# Patient Record
Sex: Male | Born: 1990 | Race: White | Hispanic: No | Marital: Single | Smoking: Former smoker
Health system: Southern US, Community
[De-identification: ages and names within clinical notes are randomized; demographics above are authoritative.]

## PROBLEM LIST (undated history)

## (undated) HISTORY — PX: TONSILLECTOMY: SUR1361

## (undated) HISTORY — PX: HERNIA REPAIR: SHX51

## (undated) HISTORY — PX: EYE SURGERY: SHX253

---

## 2011-08-27 ENCOUNTER — Inpatient Hospital Stay (INDEPENDENT_AMBULATORY_CARE_PROVIDER_SITE_OTHER)
Admission: RE | Admit: 2011-08-27 | Discharge: 2011-08-27 | Disposition: A | Discharge status: Home / Self Care | Payer: BC Managed Care – PPO | Source: Ambulatory Visit | Attending: Family Medicine | Admitting: Family Medicine

## 2011-08-27 DIAGNOSIS — N342 Other urethritis: Secondary | ICD-10-CM

## 2011-08-28 LAB — GC/CHLAMYDIA PROBE AMP, GENITAL
Chlamydia, DNA Probe: NEGATIVE
GC Probe Amp, Genital: NEGATIVE

## 2015-10-27 ENCOUNTER — Encounter: Payer: Self-pay | Admitting: Emergency Medicine

## 2015-10-27 ENCOUNTER — Ambulatory Visit
Admission: EM | Admit: 2015-10-27 | Discharge: 2015-10-27 | Disposition: A | Discharge status: Home / Self Care | Payer: BC Managed Care – PPO | Attending: Family Medicine | Admitting: Family Medicine

## 2015-10-27 DIAGNOSIS — R300 Dysuria: Secondary | ICD-10-CM | POA: Diagnosis not present

## 2015-10-27 LAB — URINALYSIS COMPLETE WITH MICROSCOPIC (ARMC ONLY)
BACTERIA UA: NONE SEEN
Bilirubin Urine: NEGATIVE
GLUCOSE, UA: NEGATIVE mg/dL
Hgb urine dipstick: NEGATIVE
Ketones, ur: NEGATIVE mg/dL
Leukocytes, UA: NEGATIVE
NITRITE: NEGATIVE
PROTEIN: NEGATIVE mg/dL
RBC / HPF: NONE SEEN RBC/hpf (ref 0–5)
Specific Gravity, Urine: 1.025 (ref 1.005–1.030)
pH: 5.5 (ref 5.0–8.0)

## 2015-10-27 LAB — CHLAMYDIA/NGC RT PCR (ARMC ONLY)
CHLAMYDIA TR: NOT DETECTED
N gonorrhoeae: NOT DETECTED

## 2015-10-27 NOTE — ED Provider Notes (Signed)
CSN: [id number]     Arrival date & time 10/27/15  [id number] History   First MD Initiated Contact with Patient 10/27/15 [phone]     Chief Complaint  Patient presents with  . Dysuria   (Consider location/radiation/quality/duration/timing/severity/associated sxs/prior Treatment) HPI Comments: 25 yo male with a c/o 2 days of mild intermittent burning discomfort of the penis when urinating and occasionally without urinating. Denies any fevers, chills, penile discharge. States in monogamous relationship for 3-4 years and uses condoms. States not concerned about STDs. States he did use a new brand of condom 2 days ago and wondering if this caused some reaction or irritation.   Patient is a 25 y.o. male presenting with dysuria. The history is provided by the patient.  Dysuria This is a new problem. The current episode started 2 days ago. The problem occurs daily (intermittently). The problem has not changed since onset.Pertinent negatives include no chest pain, no abdominal pain, no headaches and no shortness of breath. Nothing aggravates the symptoms.    History reviewed. No pertinent past medical history. History reviewed. No pertinent past surgical history. History reviewed. No pertinent family history. Social History  Substance Use Topics  . Smoking status: Former Games developermoker  . Smokeless tobacco: None  . Alcohol Use: Yes    Review of Systems  Respiratory: Negative for shortness of breath.   Cardiovascular: Negative for chest pain.  Gastrointestinal: Negative for abdominal pain.  Genitourinary: Positive for dysuria.  Neurological: Negative for headaches.    Allergies  Cefzil  Home Medications   Prior to Admission medications   Not on File   Meds Ordered and Administered this Visit  Medications - No data to display  BP 132/75 mmHg  Pulse 75  Temp(Src) 97.9 F (36.6 C) (Tympanic)  Resp 16  Ht 6' (1.829 m)  Wt 182 lb (82.555 kg)  BMI 24.68 kg/m2  SpO2 100% No data found.   Physical  Exam  Constitutional: He appears well-developed and well-nourished. No distress.  Genitourinary: Testes normal and penis normal.  Skin: He is not diaphoretic.  Nursing note and vitals reviewed.   ED Course  Procedures (including critical care time)  Labs Review Labs Reviewed  URINALYSIS COMPLETEWITH MICROSCOPIC [facility] ONLY) - Abnormal; Notable for the following:    Squamous Epithelial / LPF 0-5 (*)    All other components within normal limits  CHLAMYDIA/NGC RT PCR ([facility] ONLY)  URINE CULTURE    Imaging Review No results found.   Visual Acuity Review  Right Eye Distance:   Left Eye Distance:   Bilateral Distance:    Right Eye Near:   Left Eye Near:    Bilateral Near:         MDM   1. Dysuria   (unknown etiology)  1. Lab result (UA negative) and diagnosis reviewed with patient 2.Recommend supportive treatment with increased water intake 3. Check GC/chlamydia and urine culture; further management pending test results 4. Follow-up prn if symptoms worsen or don't improve    Payton Mccallumrlando Allexa Acoff, MD 10/27/15 [phone]

## 2015-10-27 NOTE — ED Notes (Signed)
Patient c/o burning when urinating since Saturday afternoon.

## 2015-10-28 ENCOUNTER — Telehealth: Payer: Self-pay | Admitting: Emergency Medicine

## 2015-10-28 LAB — URINE CULTURE
Culture: NO GROWTH
Special Requests: NORMAL

## 2015-10-28 NOTE — ED Notes (Signed)
Patient called wanting his culture results.  Patient was notified that his Chlamydia and GC were Negative and also his urine culture had no growth.  Patient verbalized understanding.

## 2015-12-11 ENCOUNTER — Ambulatory Visit (INDEPENDENT_AMBULATORY_CARE_PROVIDER_SITE_OTHER): Payer: BC Managed Care – PPO | Admitting: Family Medicine

## 2015-12-11 ENCOUNTER — Encounter: Payer: Self-pay | Admitting: Family Medicine

## 2015-12-11 VITALS — BP 100/62 | HR 76 | Ht 71.0 in | Wt 191.0 lb

## 2015-12-11 DIAGNOSIS — L600 Ingrowing nail: Secondary | ICD-10-CM | POA: Diagnosis not present

## 2015-12-11 MED ORDER — SULFAMETHOXAZOLE-TRIMETHOPRIM 800-160 MG PO TABS: 1.0000 | ORAL_TABLET | Freq: Two times a day (BID) | ORAL | Status: DC

## 2015-12-11 NOTE — Patient Instructions (Signed)
Ingrown Toenail  An ingrown toenail occurs when the corner or sides of your toenail grow into the surrounding skin. The big toe is most commonly affected, but it can happen to any of your toes. If your ingrown toenail is not treated, you will be at risk for infection.  CAUSES  This condition may be caused by:  · Wearing shoes that are too small or tight.  · Injury or trauma, such as stubbing your toe or having your toe stepped on.  · Improper cutting or care of your toenails.  · Being born with (congenital) nail or foot abnormalities, such as having a nail that is too big for your toe.  RISK FACTORS  Risk factors for an ingrown toenail include:  · Age. Your nails tend to thicken as you get older, so ingrown nails are more common in older people.  · Diabetes.  · Cutting your toenails incorrectly.  · Blood circulation problems.  SYMPTOMS  Symptoms may include:  · Pain, soreness, or tenderness.  · Redness.  · Swelling.  · Hardening of the skin surrounding the toe.  Your ingrown toenail may be infected if there is fluid, pus, or drainage.  DIAGNOSIS   An ingrown toenail may be diagnosed by medical history and physical exam. If your toenail is infected, your health care provider may test a sample of the drainage.  TREATMENT  Treatment depends on the severity of your ingrown toenail. Some ingrown toenails may be treated at home. More severe or infected ingrown toenails may require surgery to remove all or part of the nail. Infected ingrown toenails may also be treated with antibiotic medicines.  HOME CARE INSTRUCTIONS  · If you were prescribed an antibiotic medicine, finish all of it even if you start to feel better.  · Soak your foot in warm soapy water for 20 minutes, 3 times per day or as directed by your health care provider.  · Carefully lift the edge of the nail away from the sore skin by wedging a small piece of cotton under the corner of the nail. This may help with the pain.  Be careful not to cause more injury  to the area.  · Wear shoes that fit well. If your ingrown toenail is causing you pain, try wearing sandals, if possible.  · Trim your toenails regularly and carefully. Do not cut them in a curved shape. Cut your toenails straight across. This prevents injury to the skin at the corners of the toenail.  · Keep your feet clean and dry.  · If you are having trouble walking and are given crutches by your health care provider, use them as directed.  · Do not pick at your toenail or try to remove it yourself.  · Take medicines only as directed by your health care provider.  · Keep all follow-up visits as directed by your health care provider. This is important.  SEEK MEDICAL CARE IF:  · Your symptoms do not improve with treatment.  SEEK IMMEDIATE MEDICAL CARE IF:  · You have red streaks that start at your foot and go up your leg.  · You have a fever.  · You have increased redness, swelling, or pain.  · You have fluid, blood, or pus coming from your toenail.     This information is not intended to replace advice given to you by your health care provider. Make sure you discuss any questions you have with your health care provider.     Document Released:   10/08/2000 Document Revised: 02/25/2015 Document Reviewed: 09/04/2014  Elsevier Interactive Patient Education ©2016 [employer]

## 2015-12-11 NOTE — Progress Notes (Signed)
Name: Arthur Brooks   MRN: [id number]    DOB: 16-Feb-1991   Date:12/11/2015       Progress Note  Subjective  Chief Complaint  Chief Complaint  Patient presents with  . Toe Pain    ingrown toenail L) great toe     Toe Pain  The incident occurred more than 1 week ago. The pain is present in the left foot. The quality of the pain is described as aching. The pain is mild. The pain has been fluctuating since onset. Pertinent negatives include no inability to bear weight, loss of motion, loss of sensation, muscle weakness, numbness or tingling. He has tried nothing for the symptoms. The treatment provided mild relief.    No problem-specific assessment & plan notes found for this encounter.   No past medical history on file.  Past Surgical History  Procedure Laterality Date  . Hernia repair    . Eye surgery      lazy eye correction  . Tonsillectomy      No family history on file.  Social History   Social History  . Marital Status: Single    Spouse Name: N/A  . Number of Children: N/A  . Years of Education: N/A   Occupational History  . Not on file.   Social History Main Topics  . Smoking status: Former Games developer  . Smokeless tobacco: Not on file  . Alcohol Use: Yes  . Drug Use: Not on file  . Sexual Activity: Not on file   Other Topics Concern  . Not on file   Social History Narrative    Allergies  Allergen Reactions  . Cefzil [Cefprozil] Rash     Review of Systems  Constitutional: Negative for fever, chills, weight loss and malaise/fatigue.  HENT: Negative for ear discharge, ear pain and sore throat.   Eyes: Negative for blurred vision.  Respiratory: Negative for cough, sputum production, shortness of breath and wheezing.   Cardiovascular: Negative for chest pain, palpitations and leg swelling.  Gastrointestinal: Negative for heartburn, nausea, abdominal pain, diarrhea, constipation, blood in stool and melena.  Genitourinary: Negative for dysuria, urgency,  frequency and hematuria.  Musculoskeletal: Negative for myalgias, back pain, joint pain and neck pain.  Skin: Negative for rash.  Neurological: Negative for dizziness, tingling, sensory change, focal weakness, numbness and headaches.  Endo/Heme/Allergies: Negative for environmental allergies and polydipsia. Does not bruise/bleed easily.  Psychiatric/Behavioral: Negative for depression and suicidal ideas. The patient is not nervous/anxious and does not have insomnia.      Objective  Filed Vitals:   12/11/15 0840  BP: 100/62  Pulse: 76  Height:  (1.803 m)  Weight: 191 lb (86.637 kg)    Physical Exam  Constitutional: He is oriented to person, place, and time and well-developed, well-nourished, and in no distress.  HENT:  Head: Normocephalic.  Right Ear: External ear normal.  Left Ear: External ear normal.  Nose: Nose normal.  Mouth/Throat: Oropharynx is clear and moist.  Eyes: Conjunctivae and EOM are normal. Pupils are equal, round, and reactive to light. Right eye exhibits no discharge. Left eye exhibits no discharge. No scleral icterus.  Neck: Normal range of motion. Neck supple. No JVD present. No tracheal deviation present. No thyromegaly present.  Cardiovascular: Normal rate, regular rhythm, normal heart sounds and intact distal pulses.  Exam reveals no gallop and no friction rub.   No murmur heard. Pulmonary/Chest: Breath sounds normal. No respiratory distress. He has no wheezes. He has no rales.  Abdominal: Soft.  Bowel sounds are normal. He exhibits no mass. There is no hepatosplenomegaly. There is no tenderness. There is no rebound, no guarding and no CVA tenderness.  Musculoskeletal: Normal range of motion. He exhibits no edema.       Right foot: There is tenderness.       Feet:  Lymphadenopathy:    He has no cervical adenopathy.  Neurological: He is alert and oriented to person, place, and time. He has normal sensation, normal strength, normal reflexes and intact  cranial nerves. No cranial nerve deficit.  Skin: Skin is warm. No rash noted. There is erythema.  Psychiatric: Mood and affect normal.  Nursing note and vitals reviewed.     Assessment & Plan  Problem List Items Addressed This Visit    None    Visit Diagnoses    Ingrown left big toenail    -  Primary    area cleaned with betadine/ aneth xylocaine/ excised nail/ dressing applied    Relevant Medications    sulfamethoxazole-trimethoprim (BACTRIM DS,SEPTRA DS) 800-160 MG tablet         Dr. Hayden [facility]  12/11/2015

## 2016-06-08 ENCOUNTER — Ambulatory Visit (INDEPENDENT_AMBULATORY_CARE_PROVIDER_SITE_OTHER): Payer: BC Managed Care – PPO | Admitting: Family Medicine

## 2016-06-08 ENCOUNTER — Encounter: Payer: Self-pay | Admitting: Family Medicine

## 2016-06-08 VITALS — BP 120/80 | HR 68 | Ht 71.0 in | Wt 184.0 lb

## 2016-06-08 DIAGNOSIS — J0130 Acute sphenoidal sinusitis, unspecified: Secondary | ICD-10-CM | POA: Diagnosis not present

## 2016-06-08 MED ORDER — AZITHROMYCIN 250 MG PO TABS: ORAL_TABLET | ORAL | 1 refills | Status: DC

## 2016-06-08 NOTE — Progress Notes (Signed)
Name: Arthur Brooks   MRN: [id number]    DOB: 06/20/1991   Date:06/08/2016       Progress Note  Subjective  Chief Complaint  Chief Complaint  Patient presents with  . Sinusitis    with headache and cong, sore throat is better today    Sinusitis  This is a new problem. The current episode started in the past 7 days. The problem has been gradually worsening since onset. There has been no fever. Associated symptoms include headaches. Pertinent negatives include no chills, congestion, coughing, diaphoresis, ear pain, hoarse voice, neck pain, shortness of breath, sinus pressure, sneezing, sore throat or swollen glands. Past treatments include nothing. The treatment provided mild relief.    No problem-specific Assessment & Plan notes found for this encounter.   History reviewed. No pertinent past medical history.  Past Surgical History:  Procedure Laterality Date  . EYE SURGERY     lazy eye correction  . HERNIA REPAIR    . TONSILLECTOMY      History reviewed. No pertinent family history.  Social History   Social History  . Marital status: Single    Spouse name: N/A  . Number of children: N/A  . Years of education: N/A   Occupational History  . Not on file.   Social History Main Topics  . Smoking status: Former Games developermoker  . Smokeless tobacco: Not on file  . Alcohol use Yes  . Drug use: Unknown  . Sexual activity: Not on file   Other Topics Concern  . Not on file   Social History Narrative  . No narrative on file    Allergies  Allergen Reactions  . Cefzil [Cefprozil] Rash     Review of Systems  Constitutional: Negative for chills, diaphoresis, fever, malaise/fatigue and weight loss.  HENT: Negative for congestion, ear discharge, ear pain, hoarse voice, sinus pressure, sneezing and sore throat.   Eyes: Negative for blurred vision.  Respiratory: Negative for cough, sputum production, shortness of breath and wheezing.   Cardiovascular: Negative for chest pain,  palpitations and leg swelling.  Gastrointestinal: Negative for abdominal pain, blood in stool, constipation, diarrhea, heartburn, melena and nausea.  Genitourinary: Negative for dysuria, frequency, hematuria and urgency.  Musculoskeletal: Negative for back pain, joint pain, myalgias and neck pain.  Skin: Negative for rash.  Neurological: Positive for headaches. Negative for dizziness, tingling, sensory change and focal weakness.  Endo/Heme/Allergies: Negative for environmental allergies and polydipsia. Does not bruise/bleed easily.  Psychiatric/Behavioral: Negative for depression and suicidal ideas. The patient is not nervous/anxious and does not have insomnia.      Objective  Vitals:   06/08/16 1619  BP: 120/80  Pulse: 68  Weight: 184 lb (83.5 kg)  Height: 5\' 11"  (1.803 m)    Physical Exam  Constitutional: He is oriented to person, place, and time and well-developed, well-nourished, and in no distress.  HENT:  Head: Normocephalic.  Right Ear: External ear normal.  Left Ear: External ear normal.  Nose: Nose normal.  Mouth/Throat: Oropharynx is clear and moist.  Eyes: Conjunctivae and EOM are normal. Pupils are equal, round, and reactive to light. Right eye exhibits no discharge. Left eye exhibits no discharge. No scleral icterus.  Neck: Normal range of motion. Neck supple. No JVD present. No tracheal deviation present. No thyromegaly present.  Cardiovascular: Normal rate, regular rhythm, normal heart sounds and intact distal pulses.  Exam reveals no gallop and no friction rub.   No murmur heard. Pulmonary/Chest: Breath sounds normal. No respiratory distress.  He has no wheezes. He has no rales.  Abdominal: Soft. Bowel sounds are normal. He exhibits no mass. There is no hepatosplenomegaly. There is no tenderness. There is no rebound, no guarding and no CVA tenderness.  Musculoskeletal: Normal range of motion. He exhibits no edema or tenderness.  Lymphadenopathy:    He has no  cervical adenopathy.  Neurological: He is alert and oriented to person, place, and time. He has normal sensation, normal strength, normal reflexes and intact cranial nerves. No cranial nerve deficit.  Skin: Skin is warm. No rash noted.  Psychiatric: Mood and affect normal.  Nursing note and vitals reviewed.     Assessment & Plan  Problem List Items Addressed This Visit    None    Visit Diagnoses    Acute sphenoidal sinusitis, recurrence not specified    -  Primary   Relevant Medications   azithromycin (ZITHROMAX) 250 MG tablet        Dr. Hayden Rasmusseneanna Leily [facility]  06/08/16

## 2016-06-29 ENCOUNTER — Ambulatory Visit
Admission: RE | Admit: 2016-06-29 | Discharge: 2016-06-29 | Disposition: A | Discharge status: Home / Self Care | Payer: BC Managed Care – PPO | Source: Ambulatory Visit | Attending: Family Medicine | Admitting: Family Medicine

## 2016-06-29 ENCOUNTER — Encounter: Payer: Self-pay | Admitting: Family Medicine

## 2016-06-29 ENCOUNTER — Ambulatory Visit (INDEPENDENT_AMBULATORY_CARE_PROVIDER_SITE_OTHER): Payer: BC Managed Care – PPO | Admitting: Family Medicine

## 2016-06-29 VITALS — BP 102/70 | HR 70 | Ht 71.0 in | Wt 180.0 lb

## 2016-06-29 DIAGNOSIS — R5383 Other fatigue: Secondary | ICD-10-CM | POA: Diagnosis not present

## 2016-06-29 DIAGNOSIS — M542 Cervicalgia: Secondary | ICD-10-CM

## 2016-06-29 NOTE — Progress Notes (Signed)
Name: Arthur Brooks   MRN: [id number]    DOB: [birth year]   Date:06/29/2016       Progress Note  Subjective  Chief Complaint  Chief Complaint  Patient presents with  . Neck Pain    pain along with feeling fatigued- getting 7-8 hrs of sleep at night and still feeling tired    Neck Pain   This is a recurrent problem. The current episode started 1 to 4 weeks ago. The problem occurs daily. The problem has been gradually improving. The pain is associated with nothing. The quality of the pain is described as aching. The pain is moderate. Nothing aggravates the symptoms. Pertinent negatives include no chest pain, fever, headaches, leg pain, numbness, pain with swallowing, paresis, photophobia, syncope, tingling, trouble swallowing, visual change, weakness or weight loss. He has tried nothing for the symptoms. The treatment provided mild relief.    No problem-specific Assessment & Plan notes found for this encounter.   No past medical history on file.  Past Surgical History:  Procedure Laterality Date  . EYE SURGERY     lazy eye correction  . HERNIA REPAIR    . TONSILLECTOMY      No family history on file.  Social History   Social History  . Marital status: Single    Spouse name: N/A  . Number of children: N/A  . Years of education: N/A   Occupational History  . Not on file.   Social History Main Topics  . Smoking status: Former Games developer  . Smokeless tobacco: Not on file  . Alcohol use Yes  . Drug use: Unknown  . Sexual activity: Not on file   Other Topics Concern  . Not on file   Social History Narrative  . No narrative on file    Allergies  Allergen Reactions  . Cefzil [Cefprozil] Rash     Review of Systems  Constitutional: Negative for chills, fever, malaise/fatigue and weight loss.  HENT: Negative for ear discharge, ear pain, sore throat and trouble swallowing.   Eyes: Negative for blurred vision and photophobia.  Respiratory: Negative for cough, sputum  production, shortness of breath and wheezing.   Cardiovascular: Negative for chest pain, palpitations, leg swelling and syncope.  Gastrointestinal: Negative for abdominal pain, blood in stool, constipation, diarrhea, heartburn, melena and nausea.  Genitourinary: Negative for dysuria, frequency, hematuria and urgency.  Musculoskeletal: Positive for neck pain. Negative for back pain, joint pain and myalgias.  Skin: Negative for rash.  Neurological: Negative for dizziness, tingling, sensory change, focal weakness, weakness, numbness and headaches.  Endo/Heme/Allergies: Negative for environmental allergies and polydipsia. Does not bruise/bleed easily.  Psychiatric/Behavioral: Negative for depression and suicidal ideas. The patient is not nervous/anxious and does not have insomnia.      Objective  Vitals:   06/29/16 1618  BP: 102/70  Pulse: 70  Weight: 180 lb (81.6 kg)  Height: 5\' 11"  (1.803 m)    Physical Exam  Constitutional: He is oriented to person, place, and time and well-developed, well-nourished, and in no distress.  HENT:  Head: Normocephalic.  Right Ear: External ear normal.  Left Ear: External ear normal.  Nose: Nose normal.  Mouth/Throat: Oropharynx is clear and moist.  Eyes: Conjunctivae and EOM are normal. Pupils are equal, round, and reactive to light. Right eye exhibits no discharge. Left eye exhibits no discharge. No scleral icterus.  Neck: Normal range of motion. Neck supple. No JVD present. No tracheal deviation present. No thyromegaly present.  Cardiovascular: Normal rate, regular rhythm,  normal heart sounds and intact distal pulses.  Exam reveals no gallop and no friction rub.   No murmur heard. Pulmonary/Chest: Breath sounds normal. No respiratory distress. He has no wheezes. He has no rales.  Abdominal: Soft. Bowel sounds are normal. He exhibits no mass. There is no hepatosplenomegaly. There is no tenderness. There is no rebound, no guarding and no CVA tenderness.   Musculoskeletal: Normal range of motion. He exhibits no edema or tenderness.  Lymphadenopathy:    He has no cervical adenopathy.  Neurological: He is alert and oriented to person, place, and time. He has normal sensation, normal strength, normal reflexes and intact cranial nerves. No cranial nerve deficit.  Skin: Skin is warm. No rash noted.  Psychiatric: Mood and affect normal.  Nursing note and vitals reviewed.     Assessment & Plan  Problem List Items Addressed This Visit    None    Visit Diagnoses    Other fatigue    -  Primary   check with wife about sleep apnea   Relevant Orders   CBC   Renal Function Panel   TSH   Neck pain       Relevant Orders   DG Cervical Spine Complete        Dr[facility]  06/29/16

## 2016-06-30 LAB — RENAL FUNCTION PANEL
ALBUMIN: 5 g/dL (ref 3.5–5.5)
BUN/Creatinine Ratio: 12 (ref 9–20)
BUN: 13 mg/dL (ref 6–20)
CHLORIDE: 98 mmol/L (ref 96–106)
CO2: 26 mmol/L (ref 18–29)
Calcium: 9.9 mg/dL (ref 8.7–10.2)
Creatinine, Ser: 1.12 mg/dL (ref 0.76–1.27)
GFR, EST AFRICAN AMERICAN: 105 mL/min/{1.73_m2} (ref >59)
GFR, EST NON AFRICAN AMERICAN: 91 mL/min/{1.73_m2} (ref >59)
Glucose: 91 mg/dL (ref 65–99)
PHOSPHORUS: 3.2 mg/dL (ref 2.5–4.5)
Potassium: 4.1 mmol/L (ref 3.5–5.2)
Sodium: 141 mmol/L (ref 134–144)

## 2016-06-30 LAB — CBC
HEMATOCRIT: 41 % (ref 37.5–51.0)
HEMOGLOBIN: 13.9 g/dL (ref 12.6–17.7)
MCH: 31.5 pg (ref 26.6–33.0)
MCHC: 33.9 g/dL (ref 31.5–35.7)
MCV: 93 fL (ref 79–97)
Platelets: 212 10*3/uL (ref 150–379)
RBC: 4.41 x10E6/uL (ref 4.14–5.80)
RDW: 13.4 % (ref 12.3–15.4)
WBC: 5.3 10*3/uL (ref 3.4–10.8)

## 2016-06-30 LAB — TSH: TSH: 1.98 u[IU]/mL (ref 0.450–4.500)

## 2016-07-27 ENCOUNTER — Other Ambulatory Visit: Payer: Self-pay | Admitting: Orthopedic Surgery

## 2016-07-27 DIAGNOSIS — S8992XA Unspecified injury of left lower leg, initial encounter: Secondary | ICD-10-CM | POA: Insufficient documentation

## 2016-07-28 ENCOUNTER — Other Ambulatory Visit: Payer: Self-pay | Admitting: Orthopedic Surgery

## 2016-07-28 DIAGNOSIS — M25562 Pain in left knee: Secondary | ICD-10-CM

## 2016-08-01 ENCOUNTER — Ambulatory Visit
Admission: RE | Admit: 2016-08-01 | Discharge: 2016-08-01 | Disposition: A | Discharge status: Home / Self Care | Payer: BC Managed Care – PPO | Source: Ambulatory Visit | Attending: Orthopedic Surgery | Admitting: Orthopedic Surgery

## 2016-08-01 ENCOUNTER — Other Ambulatory Visit: Payer: BC Managed Care – PPO

## 2016-08-01 DIAGNOSIS — M25562 Pain in left knee: Secondary | ICD-10-CM

## 2016-08-10 ENCOUNTER — Ambulatory Visit: Payer: BC Managed Care – PPO

## 2016-08-26 ENCOUNTER — Other Ambulatory Visit: Payer: Self-pay

## 2017-05-20 ENCOUNTER — Ambulatory Visit (INDEPENDENT_AMBULATORY_CARE_PROVIDER_SITE_OTHER): Payer: BC Managed Care – PPO | Admitting: Family Medicine

## 2017-05-20 ENCOUNTER — Encounter: Payer: Self-pay | Admitting: Family Medicine

## 2017-05-20 VITALS — BP 120/80 | HR 80 | Ht 71.0 in | Wt 189.0 lb

## 2017-05-20 DIAGNOSIS — L600 Ingrowing nail: Secondary | ICD-10-CM | POA: Diagnosis not present

## 2017-05-20 MED ORDER — SULFAMETHOXAZOLE-TRIMETHOPRIM 800-160 MG PO TABS: 1.0000 | ORAL_TABLET | Freq: Two times a day (BID) | ORAL | 0 refills | Status: DC

## 2017-05-20 MED ORDER — MUPIROCIN 2 % EX OINT: 1.0000 "application " | TOPICAL_OINTMENT | Freq: Two times a day (BID) | CUTANEOUS | 0 refills | Status: DC

## 2017-05-20 NOTE — Progress Notes (Signed)
Name: Arthur Brooks   MRN: [name]    DOB: 12/18/1990   Date:05/20/2017       Progress Note  Subjective  Chief Complaint  Chief Complaint  Patient presents with  . Toe Pain    L) great toenail is ingrown    Toe Pain   The incident occurred 2 days ago. There was no injury mechanism. The pain is present in the left toes. The quality of the pain is described as aching. The pain is mild. The pain has been constant since onset. Pertinent negatives include no inability to bear weight, muscle weakness or tingling. The symptoms are aggravated by weight bearing.  Rash  This is a new problem. The problem has been gradually worsening since onset. The affected locations include the right toes. The rash is characterized by redness and swelling. Pertinent negatives include no cough, diarrhea, fever, joint pain, shortness of breath or sore throat. Treatments tried: self surgery. The treatment provided no relief.    No problem-specific Assessment & Plan notes found for this encounter.   No past medical history on file.  Past Surgical History:  Procedure Laterality Date  . EYE SURGERY     lazy eye correction  . HERNIA REPAIR    . TONSILLECTOMY      No family history on file.  Social History   Social History  . Marital status: Single    Spouse name: N/A  . Number of children: N/A  . Years of education: N/A   Occupational History  . Not on file.   Social History Main Topics  . Smoking status: Former Games developermoker  . Smokeless tobacco: Never Used  . Alcohol use Yes  . Drug use: No  . Sexual activity: Yes   Other Topics Concern  . Not on file   Social History Narrative  . No narrative on file    Allergies  Allergen Reactions  . Cefzil [Cefprozil] Rash    No outpatient prescriptions prior to visit.   No facility-administered medications prior to visit.     Review of Systems  Constitutional: Negative for chills, fever, malaise/fatigue and weight loss.  HENT: Negative for  ear discharge, ear pain and sore throat.   Eyes: Negative for blurred vision.  Respiratory: Negative for cough, sputum production, shortness of breath and wheezing.   Cardiovascular: Negative for chest pain, palpitations and leg swelling.  Gastrointestinal: Negative for abdominal pain, blood in stool, constipation, diarrhea, heartburn, melena and nausea.  Genitourinary: Negative for dysuria, frequency, hematuria and urgency.  Musculoskeletal: Negative for back pain, joint pain, myalgias and neck pain.  Skin: Positive for rash.  Neurological: Negative for dizziness, tingling, sensory change, focal weakness and headaches.  Endo/Heme/Allergies: Negative for environmental allergies and polydipsia. Does not bruise/bleed easily.  Psychiatric/Behavioral: Negative for depression and suicidal ideas. The patient is not nervous/anxious and does not have insomnia.      Objective  Vitals:   05/20/17 0954  BP: 120/80  Pulse: 80  Weight: 189 lb (85.7 kg)  Height: 5\' 11"  (1.803 m)    Physical Exam  Musculoskeletal:       Feet:  Skin: Skin is warm and dry. There is erythema.  Left lateral great toe  Nursing note and vitals reviewed.     Assessment & Plan  Problem List Items Addressed This Visit    None    Visit Diagnoses    Ingrown toenail of left foot with infection    -  Primary   consent for lateral edge  removal/ anth with 2% xylocaine plain/ elevate with hemostat/ incision / removal of nail/ dressing   Relevant Medications   sulfamethoxazole-trimethoprim (BACTRIM DS,SEPTRA DS) 800-160 MG tablet   mupirocin ointment (BACTROBAN) 2 %      Meds ordered this encounter  Medications  . sulfamethoxazole-trimethoprim (BACTRIM DS,SEPTRA DS) 800-160 MG tablet    Sig: Take 1 tablet by mouth 2 (two) times daily.    Dispense:  20 tablet    Refill:  0  . mupirocin ointment (BACTROBAN) 2 %    Sig: Apply 1 application topically 2 (two) times daily.    Dispense:  22 g    Refill:  0       Dr. Hayden Rasmusseneanna Kandra Graven [facility]  05/20/17

## 2017-05-26 ENCOUNTER — Telehealth: Payer: Self-pay

## 2017-05-26 DIAGNOSIS — Z872 Personal history of diseases of the skin and subcutaneous tissue: Secondary | ICD-10-CM

## 2017-05-26 DIAGNOSIS — L600 Ingrowing nail: Secondary | ICD-10-CM

## 2017-05-26 DIAGNOSIS — Z9889 Other specified postprocedural states: Principal | ICD-10-CM

## 2017-05-26 MED ORDER — SULFAMETHOXAZOLE-TRIMETHOPRIM 800-160 MG PO TABS: 1.0000 | ORAL_TABLET | Freq: Two times a day (BID) | ORAL | 0 refills | Status: DC

## 2017-05-26 NOTE — Telephone Encounter (Signed)
Pt called in stating that he had a mole removed and it looks red and irritated- [facility] [address] [facility] [place]. Cont using Bactroban cream and return to clinic if doesn't seem to get better

## 2017-08-16 ENCOUNTER — Other Ambulatory Visit: Payer: Self-pay

## 2017-08-16 ENCOUNTER — Encounter: Payer: Self-pay | Admitting: Family Medicine

## 2017-08-16 ENCOUNTER — Ambulatory Visit (INDEPENDENT_AMBULATORY_CARE_PROVIDER_SITE_OTHER): Payer: BC Managed Care – PPO | Admitting: Family Medicine

## 2017-08-16 VITALS — BP 110/70 | HR 80 | Temp 98.1°F | Ht 71.0 in | Wt 189.0 lb

## 2017-08-16 DIAGNOSIS — J0100 Acute maxillary sinusitis, unspecified: Secondary | ICD-10-CM | POA: Diagnosis not present

## 2017-08-16 MED ORDER — AZITHROMYCIN 250 MG PO TABS: ORAL_TABLET | ORAL | 1 refills | Status: DC

## 2017-08-16 NOTE — Progress Notes (Signed)
Name: Remi DeterHunter Ent   MRN: [id number]    DOB: 10/04/1991   Date:08/16/2017       Progress Note  Subjective  Chief Complaint  Chief Complaint  Patient presents with  . Sinusitis    cough and cong- no color just thick.  . Sore Throat    L) ear pain and "just feel bad"    Sinusitis  This is a new problem. The current episode started in the past 7 days. The problem has been gradually worsening since onset. There has been no fever. Associated symptoms include congestion, coughing, ear pain, sinus pressure and a sore throat. Pertinent negatives include no chills, diaphoresis, headaches, hoarse voice, neck pain or shortness of breath. Past treatments include oral decongestants.  Sore Throat   This is a chronic problem. The current episode started in the past 7 days. The problem has been gradually worsening. There has been no fever. Associated symptoms include congestion, coughing and ear pain. Pertinent negatives include no abdominal pain, diarrhea, ear discharge, headaches, hoarse voice, neck pain or shortness of breath.    No problem-specific Assessment & Plan notes found for this encounter.   No past medical history on file.  Past Surgical History:  Procedure Laterality Date  . EYE SURGERY     lazy eye correction  . HERNIA REPAIR    . TONSILLECTOMY      No family history on file.  Social History   Social History  . Marital status: Single    Spouse name: N/A  . Number of children: N/A  . Years of education: N/A   Occupational History  . Not on file.   Social History Main Topics  . Smoking status: Former Games developermoker  . Smokeless tobacco: Never Used  . Alcohol use Yes  . Drug use: No  . Sexual activity: Yes   Other Topics Concern  . Not on file   Social History Narrative  . No narrative on file    Allergies  Allergen Reactions  . Cefzil [Cefprozil] Rash    Outpatient Medications Prior to Visit  Medication Sig Dispense Refill  . mupirocin ointment (BACTROBAN) 2  % Apply 1 application topically 2 (two) times daily. 22 g 0  . sulfamethoxazole-trimethoprim (BACTRIM DS,SEPTRA DS) 800-160 MG tablet Take 1 tablet by mouth 2 (two) times daily. 14 tablet 0   No facility-administered medications prior to visit.     Review of Systems  Constitutional: Negative for chills, diaphoresis, fever, malaise/fatigue and weight loss.  HENT: Positive for congestion, ear pain, sinus pressure and sore throat. Negative for ear discharge and hoarse voice.   Eyes: Negative for blurred vision.  Respiratory: Positive for cough. Negative for sputum production, shortness of breath and wheezing.   Cardiovascular: Negative for chest pain, palpitations and leg swelling.  Gastrointestinal: Negative for abdominal pain, blood in stool, constipation, diarrhea, heartburn, melena and nausea.  Genitourinary: Negative for dysuria, frequency, hematuria and urgency.  Musculoskeletal: Negative for back pain, joint pain, myalgias and neck pain.  Skin: Negative for rash.  Neurological: Negative for dizziness, tingling, sensory change, focal weakness and headaches.  Endo/Heme/Allergies: Negative for environmental allergies and polydipsia. Does not bruise/bleed easily.  Psychiatric/Behavioral: Negative for depression and suicidal ideas. The patient is not nervous/anxious and does not have insomnia.      Objective  Vitals:   08/16/17 1446  BP: 110/70  Pulse: 80  Temp: 98.1 F (36.7 C)  TempSrc: Oral  Weight: 189 lb (85.7 kg)  Height: 5\' 11"  (1.803 m)  Physical Exam  Constitutional: He is oriented to person, place, and time and well-developed, well-nourished, and in no distress.  HENT:  Head: Normocephalic.  Right Ear: External ear normal.  Left Ear: External ear normal.  Nose: Right sinus exhibits maxillary sinus tenderness. Left sinus exhibits maxillary sinus tenderness.  Mouth/Throat: Oropharynx is clear and moist.  Eyes: Pupils are equal, round, and reactive to light.  Conjunctivae and EOM are normal. Right eye exhibits no discharge. Left eye exhibits no discharge. No scleral icterus.  Neck: Normal range of motion. Neck supple. No JVD present. No tracheal deviation present. No thyromegaly present.  Cardiovascular: Normal rate, regular rhythm, normal heart sounds and intact distal pulses.  Exam reveals no gallop and no friction rub.   No murmur heard. Pulmonary/Chest: Breath sounds normal. No respiratory distress. He has no wheezes. He has no rales.  Abdominal: Soft. Bowel sounds are normal. He exhibits no mass. There is no hepatosplenomegaly. There is no tenderness. There is no rebound, no guarding and no CVA tenderness.  Musculoskeletal: Normal range of motion. He exhibits no edema or tenderness.  Lymphadenopathy:    He has no cervical adenopathy.  Neurological: He is alert and oriented to person, place, and time. He has normal sensation, normal strength, normal reflexes and intact cranial nerves. No cranial nerve deficit.  Skin: Skin is warm. No rash noted.  Psychiatric: Mood and affect normal.  Nursing note and vitals reviewed.     Assessment & Plan  Problem List Items Addressed This Visit    None    Visit Diagnoses    Acute maxillary sinusitis, recurrence not specified    -  Primary   Relevant Medications   azithromycin (ZITHROMAX) 250 MG tablet      Meds ordered this encounter  Medications  . azithromycin (ZITHROMAX) 250 MG tablet    Sig: 2 today then 1 a day for 4 days    Dispense:  6 tablet    Refill:  1      Dr. Hayden [facility]  08/16/17

## 2017-10-21 ENCOUNTER — Other Ambulatory Visit: Payer: Self-pay

## 2017-12-22 ENCOUNTER — Ambulatory Visit: Payer: BC Managed Care – PPO | Admitting: Family Medicine

## 2017-12-22 ENCOUNTER — Encounter: Payer: Self-pay | Admitting: Family Medicine

## 2017-12-22 VITALS — BP 120/80 | HR 80 | Ht 71.0 in | Wt 189.0 lb

## 2017-12-22 DIAGNOSIS — J358 Other chronic diseases of tonsils and adenoids: Secondary | ICD-10-CM | POA: Diagnosis not present

## 2017-12-22 MED ORDER — ACYCLOVIR 200 MG PO CAPS: 200.0000 mg | ORAL_CAPSULE | Freq: Four times a day (QID) | ORAL | 1 refills | Status: DC

## 2017-12-22 NOTE — Progress Notes (Signed)
Name: Arthur Brooks   MRN: [id number]    DOB: 11/14/1990   Date:12/22/2017       Progress Note  Subjective  Chief Complaint  Chief Complaint  Patient presents with  . Mouth Lesions    "sore in th e back of throat"- started a ZPack this morning    Mouth Lesions   The current episode started 2 days ago. The onset was sudden. The problem has been gradually worsening. The problem is moderate. Associated symptoms include mouth sores and sore throat. Pertinent negatives include no fever, no decreased vision, no double vision, no eye itching, no photophobia, no abdominal pain, no constipation, no diarrhea, no nausea, no congestion, no ear discharge, no ear pain, no headaches, no hearing loss, no rhinorrhea, no stridor, no swollen glands, no neck pain, no neck stiffness, no cough, no wheezing, no rash, no eye discharge, no eye pain and no eye redness.    No problem-specific Assessment & Plan notes found for this encounter.   History reviewed. No pertinent past medical history.  Past Surgical History:  Procedure Laterality Date  . EYE SURGERY     lazy eye correction  . HERNIA REPAIR    . TONSILLECTOMY      History reviewed. No pertinent family history.  Social History   Socioeconomic History  . Marital status: Single    Spouse name: Not on file  . Number of children: Not on file  . Years of education: Not on file  . Highest education level: Not on file  Social Needs  . Financial resource strain: Not on file  . Food insecurity - worry: Not on file  . Food insecurity - inability: Not on file  . Transportation needs - medical: Not on file  . Transportation needs - non-medical: Not on file  Occupational History  . Not on file  Tobacco Use  . Smoking status: Former Games developermoker  . Smokeless tobacco: Never Used  Substance and Sexual Activity  . Alcohol use: Yes  . Drug use: No  . Sexual activity: Yes  Other Topics Concern  . Not on file  Social History Narrative  . Not on file     Allergies  Allergen Reactions  . Cefzil [Cefprozil] Rash    Outpatient Medications Prior to Visit  Medication Sig Dispense Refill  . azithromycin (ZITHROMAX) 250 MG tablet Take by mouth daily.    Marland Kitchen. azithromycin (ZITHROMAX) 250 MG tablet 2 today then 1 a day for 4 days 6 tablet 1   No facility-administered medications prior to visit.     Review of Systems  Constitutional: Negative for chills, fever, malaise/fatigue and weight loss.  HENT: Positive for mouth sores and sore throat. Negative for congestion, ear discharge, ear pain, hearing loss and rhinorrhea.   Eyes: Negative for blurred vision, double vision, photophobia, pain, discharge, redness and itching.  Respiratory: Negative for cough, sputum production, shortness of breath, wheezing and stridor.   Cardiovascular: Negative for chest pain, palpitations and leg swelling.  Gastrointestinal: Negative for abdominal pain, blood in stool, constipation, diarrhea, heartburn, melena and nausea.  Genitourinary: Negative for dysuria, frequency, hematuria and urgency.  Musculoskeletal: Negative for back pain, joint pain, myalgias and neck pain.  Skin: Negative for rash.  Neurological: Negative for dizziness, tingling, sensory change, focal weakness and headaches.  Endo/Heme/Allergies: Negative for environmental allergies and polydipsia. Does not bruise/bleed easily.  Psychiatric/Behavioral: Negative for depression and suicidal ideas. The patient is not nervous/anxious and does not have insomnia.  Objective  Vitals:   12/22/17 1325  BP: 120/80  Pulse: 80  Weight: 189 lb (85.7 kg)  Height: 5\' 11"  (1.803 m)    Physical Exam  Constitutional: He is oriented to person, place, and time and well-developed, well-nourished, and in no distress.  HENT:  Head: Normocephalic.  Right Ear: Tympanic membrane, external ear and ear canal normal.  Left Ear: Tympanic membrane, external ear and ear canal normal.  Nose: Nose normal.   Mouth/Throat: Mucous membranes are normal. Uvula swelling present. Posterior oropharyngeal erythema present. No oropharyngeal exudate or posterior oropharyngeal edema.  Eyes: Conjunctivae and EOM are normal. Pupils are equal, round, and reactive to light. Right eye exhibits no discharge. Left eye exhibits no discharge. No scleral icterus.  Neck: Normal range of motion. Neck supple. No JVD present. No tracheal deviation present. No thyromegaly present.  Cardiovascular: Normal rate, regular rhythm, normal heart sounds and intact distal pulses. Exam reveals no gallop and no friction rub.  No murmur heard. Pulmonary/Chest: Breath sounds normal. No respiratory distress. He has no wheezes. He has no rales.  Abdominal: Soft. Bowel sounds are normal. He exhibits no mass. There is no hepatosplenomegaly. There is no tenderness. There is no rebound, no guarding and no CVA tenderness.  Musculoskeletal: Normal range of motion. He exhibits no edema or tenderness.  Lymphadenopathy:       Head (right side): Submandibular adenopathy present.       Head (left side): Submandibular adenopathy present.    He has no cervical adenopathy.  Neurological: He is alert and oriented to person, place, and time. He has normal sensation, normal strength, normal reflexes and intact cranial nerves. No cranial nerve deficit.  Skin: Skin is warm. No rash noted.  Psychiatric: Mood and affect normal.  Nursing note and vitals reviewed.     Assessment & Plan  Problem List Items Addressed This Visit    None    Visit Diagnoses    Aphthous ulcer of tonsil    -  Primary   Relevant Medications   acyclovir (ZOVIRAX) 200 MG capsule      Meds ordered this encounter  Medications  . acyclovir (ZOVIRAX) 200 MG capsule    Sig: Take 1 capsule (200 mg total) by mouth 4 (four) times daily.    Dispense:  30 capsule    Refill:  1      Dr. Elizabeth Sauer [facility]  12/22/17

## 2017-12-28 ENCOUNTER — Ambulatory Visit: Payer: BC Managed Care – PPO | Admitting: Family Medicine

## 2017-12-28 ENCOUNTER — Encounter: Payer: Self-pay | Admitting: Family Medicine

## 2017-12-28 VITALS — BP 120/80 | HR 80 | Temp 98.2°F | Ht 71.0 in | Wt 187.0 lb

## 2017-12-28 DIAGNOSIS — J358 Other chronic diseases of tonsils and adenoids: Secondary | ICD-10-CM

## 2017-12-28 DIAGNOSIS — K120 Recurrent oral aphthae: Secondary | ICD-10-CM

## 2017-12-28 MED ORDER — PREDNISONE 10 MG PO TABS: 10.0000 mg | ORAL_TABLET | Freq: Every day | ORAL | 0 refills | Status: DC

## 2017-12-28 MED ORDER — DOXYCYCLINE HYCLATE 100 MG PO TABS: 100.0000 mg | ORAL_TABLET | Freq: Two times a day (BID) | ORAL | 0 refills | Status: DC

## 2017-12-28 NOTE — Progress Notes (Signed)
Name: Arthur Brooks   MRN: [id number]    DOB: 02-20-1991   Date:12/28/2017       Progress Note  Subjective  Chief Complaint  Chief Complaint  Patient presents with  . Sore Throat    cong- throat is better today and sore in the back of throat appears to be smaller today    Sore Throat   This is a new problem. The current episode started 1 to 4 weeks ago. There has been no fever. The pain is mild. Pertinent negatives include no abdominal pain, congestion, coughing, diarrhea, ear discharge, ear pain, headaches, plugged ear sensation, neck pain or shortness of breath.    No problem-specific Assessment & Plan notes found for this encounter.   No past medical history on file.  Past Surgical History:  Procedure Laterality Date  . EYE SURGERY     lazy eye correction  . HERNIA REPAIR    . TONSILLECTOMY      No family history on file.  Social History   Socioeconomic History  . Marital status: Single    Spouse name: Not on file  . Number of children: Not on file  . Years of education: Not on file  . Highest education level: Not on file  Social Needs  . Financial resource strain: Not on file  . Food insecurity - worry: Not on file  . Food insecurity - inability: Not on file  . Transportation needs - medical: Not on file  . Transportation needs - non-medical: Not on file  Occupational History  . Not on file  Tobacco Use  . Smoking status: Former Games developer  . Smokeless tobacco: Never Used  Substance and Sexual Activity  . Alcohol use: Yes  . Drug use: No  . Sexual activity: Yes  Other Topics Concern  . Not on file  Social History Narrative  . Not on file    Allergies  Allergen Reactions  . Cefzil [Cefprozil] Rash    Outpatient Medications Prior to Visit  Medication Sig Dispense Refill  . acyclovir (ZOVIRAX) 200 MG capsule Take 1 capsule (200 mg total) by mouth 4 (four) times daily. 30 capsule 1  . azithromycin (ZITHROMAX) 250 MG tablet Take by mouth daily.     No  facility-administered medications prior to visit.     Review of Systems  Constitutional: Negative for chills, fever, malaise/fatigue and weight loss.  HENT: Negative for congestion, ear discharge, ear pain and sore throat.   Eyes: Negative for blurred vision.  Respiratory: Negative for cough, sputum production, shortness of breath and wheezing.   Cardiovascular: Negative for chest pain, palpitations and leg swelling.  Gastrointestinal: Negative for abdominal pain, blood in stool, constipation, diarrhea, heartburn, melena and nausea.  Genitourinary: Negative for dysuria, frequency, hematuria and urgency.  Musculoskeletal: Negative for back pain, joint pain, myalgias and neck pain.  Skin: Negative for rash.  Neurological: Negative for dizziness, tingling, sensory change, focal weakness and headaches.  Endo/Heme/Allergies: Negative for environmental allergies and polydipsia. Does not bruise/bleed easily.  Psychiatric/Behavioral: Negative for depression and suicidal ideas. The patient is not nervous/anxious and does not have insomnia.      Objective  Vitals:   12/28/17 1011  BP: 120/80  Pulse: 80  Temp: 98.2 F (36.8 C)  TempSrc: Oral  Weight: 187 lb (84.8 kg)  Height: 5\' 11"  (1.803 m)    Physical Exam  Constitutional: He is oriented to person, place, and time and well-developed, well-nourished, and in no distress.  HENT:  Head: Normocephalic.  Right Ear: Tympanic membrane, external ear and ear canal normal.  Left Ear: Tympanic membrane, external ear and ear canal normal.  Nose: Nose normal. No mucosal edema or rhinorrhea. Right sinus exhibits no maxillary sinus tenderness and no frontal sinus tenderness. Left sinus exhibits no maxillary sinus tenderness and no frontal sinus tenderness.  Mouth/Throat: Mucous membranes are normal. Oral lesions present. Uvula swelling present. Posterior oropharyngeal edema and posterior oropharyngeal erythema present. No oropharyngeal exudate or  tonsillar abscesses.  shallow /graybase ulceration left uvula  Eyes: Conjunctivae and EOM are normal. Pupils are equal, round, and reactive to light. Right eye exhibits no discharge. Left eye exhibits no discharge. No scleral icterus.  Neck: Normal range of motion. Neck supple. No JVD present. No tracheal deviation present. No thyromegaly present.  Cardiovascular: Normal rate, regular rhythm, normal heart sounds and intact distal pulses. Exam reveals no gallop and no friction rub.  No murmur heard. Pulmonary/Chest: Breath sounds normal. No respiratory distress. He has no wheezes. He has no rales.  Abdominal: Soft. Bowel sounds are normal. He exhibits no mass. There is no hepatosplenomegaly. There is no tenderness. There is no rebound, no guarding and no CVA tenderness.  Musculoskeletal: Normal range of motion. He exhibits no edema or tenderness.  Lymphadenopathy:    He has no cervical adenopathy.  Neurological: He is alert and oriented to person, place, and time. He has normal sensation, normal strength, normal reflexes and intact cranial nerves. No cranial nerve deficit.  Skin: Skin is warm. No rash noted.  Psychiatric: Mood and affect normal.  Nursing note and vitals reviewed.     Assessment & Plan  Problem List Items Addressed This Visit    None    Visit Diagnoses    Aphthae, oral    -  Primary   Relevant Medications   doxycycline (VIBRA-TABS) 100 MG tablet   predniSONE (DELTASONE) 10 MG tablet   Aphthous ulcer of tonsil          Meds ordered this encounter  Medications  . doxycycline (VIBRA-TABS) 100 MG tablet    Sig: Take 1 tablet (100 mg total) by mouth 2 (two) times daily.    Dispense:  20 tablet    Refill:  0  . predniSONE (DELTASONE) 10 MG tablet    Sig: Take 1 tablet (10 mg total) by mouth daily with breakfast.    Dispense:  30 tablet    Refill:  0      Dr. Hayden Rasmusseneanna Jones [facility]  12/28/17

## 2017-12-28 NOTE — Patient Instructions (Signed)
Canker Sores Canker sores are small, painful sores that develop inside your mouth. They may also be called aphthous ulcers. You can get canker sores on the inside of your lips or cheeks, on your tongue, or anywhere inside your mouth. You can have just one canker sore or several of them. Canker sores cannot be passed from one person to another (noncontagious). These sores are different than the sores that you may get on the outside of your lips (cold sores or fever blisters). Canker sores usually start as painful red bumps. Then they turn into small white, yellow, or gray ulcers that have red borders. The ulcers may be quite painful. The pain may be worse when you eat or drink. What are the causes? The cause of this condition is not known. What increases the risk? This condition is more likely to develop in:  Women.  People in their teens or 20s.  Women who are having their menstrual period.  People who are under a lot of emotional stress.  People who do not get enough iron or B vitamins.  People who have poor oral hygiene.  People who have an injury inside the mouth. This can happen after having dental work or from chewing something hard.  What are the signs or symptoms? Along with the canker sore, symptoms may also include:  Fever.  Fatigue.  Swollen lymph nodes in your neck.  How is this diagnosed? This condition can be diagnosed based on your symptoms. Your health care provider will also examine your mouth. Your health care provider may also do tests if you get canker sores often or if they are very bad. Tests may include:  Blood tests to rule out other causes of canker sores.  Taking swabs from the sore to check for infection.  Taking a small piece of skin from the sore (biopsy) to test it for cancer.  How is this treated? Most canker sores clear up without treatment in about 10 days. Home care is usually the only treatment that you will need. Over-the-counter medicines  can relieve discomfort.If you have severe canker sores, your health care provider may prescribe:  Numbing ointment to relieve pain.  Vitamins.  Steroid medicines. These may be given as: ? Oral pills. ? Mouth rinses. ? Gels.  Antibiotic mouth rinse.  Follow these instructions at home:  Apply, take, or use medicines only as directed by your health care provider. These include vitamins.  If you were prescribed an antibiotic mouth rinse, finish all of it even if you start to feel better.  Until the sores are healed: ? Do not drink coffee or citrus juices. ? Do not eat spicy or salty foods.  Use a mild, over-the-counter mouth rinse as directed by your health care provider.  Practice good oral hygiene. ? Floss your teeth every day. ? Brush your teeth with a soft brush twice each day. Contact a health care provider if:  Your symptoms do not get better after two weeks.  You also have a fever or swollen glands.  You get canker sores often.  You have a canker sore that is getting larger.  You cannot eat or drink due to your canker sores. This information is not intended to replace advice given to you by your health care provider. Make sure you discuss any questions you have with your health care provider. Document Released: 02/05/2011 Document Revised: 03/18/2016 Document Reviewed: 09/11/2014 Elsevier Interactive Patient Education  2018 [employer] Inc[employer]  

## 2018-07-11 IMAGING — CR DG CERVICAL SPINE COMPLETE 4+V
6 series · 6 of 6 positions shown · non-contrast
Comparison: None.

CLINICAL DATA: Posterior base skull and neck pain for 1-2 months.
Fatigue.

EXAM:
CERVICAL SPINE - COMPLETE 4+ VIEW

[c-spine lat]
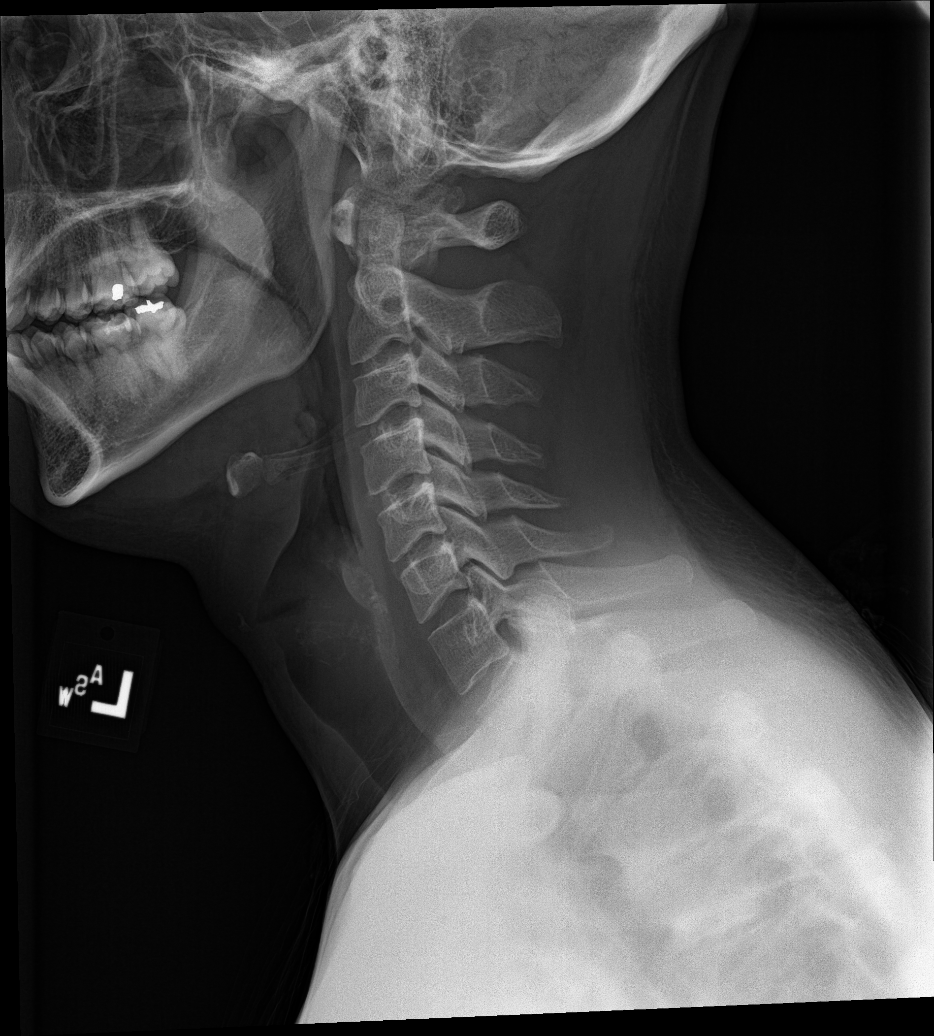

[c-spine obl (1 of 2)]
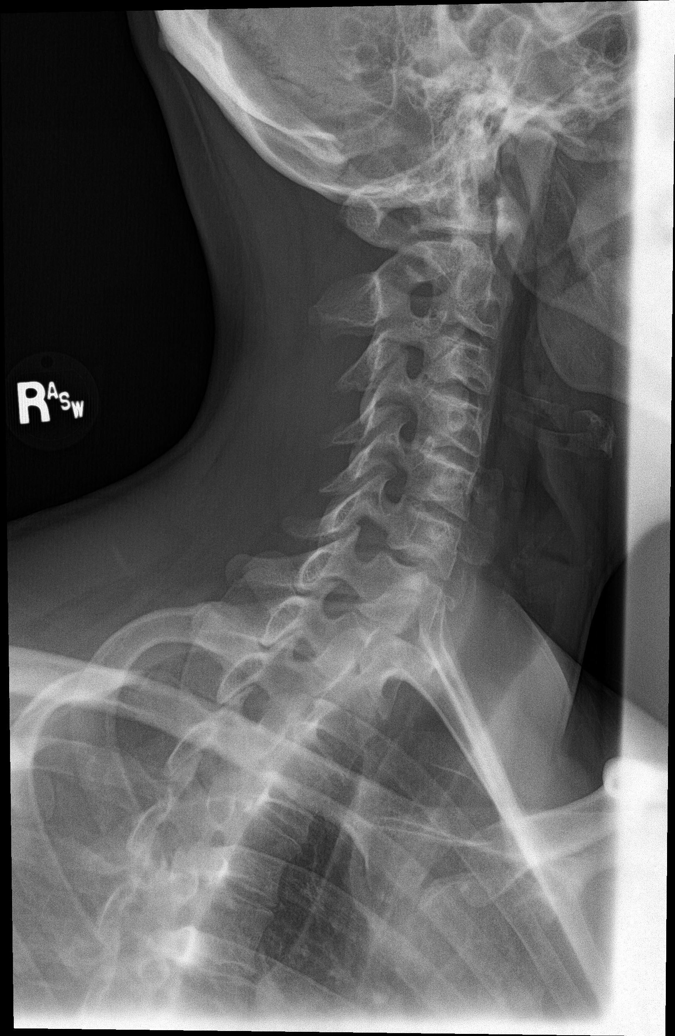

[c-spine obl (2 of 2)]
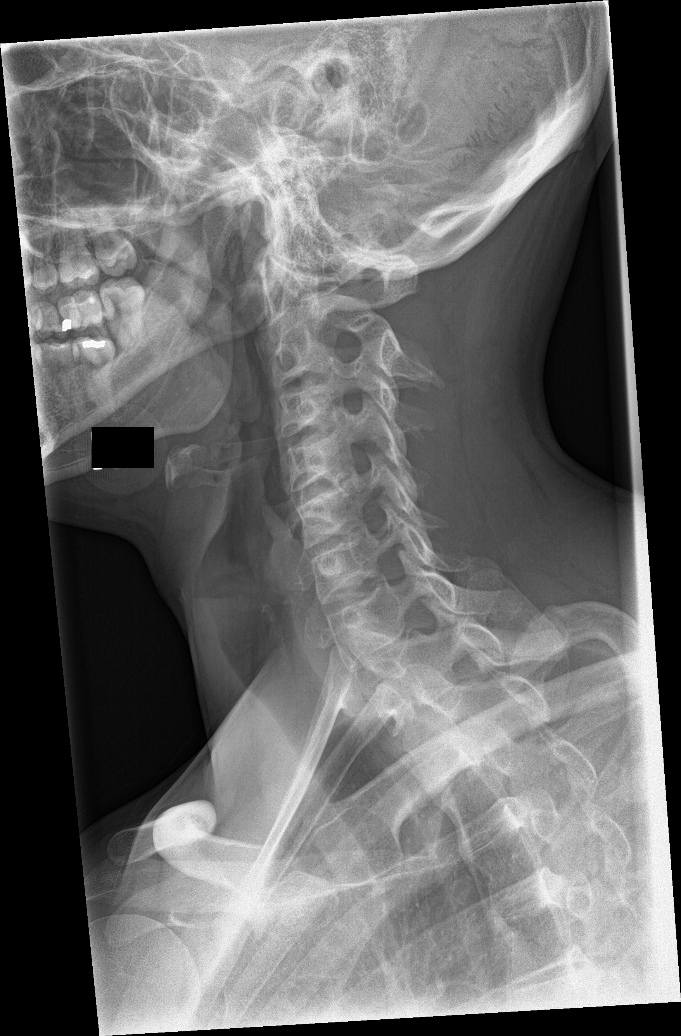

[c-spine ap]
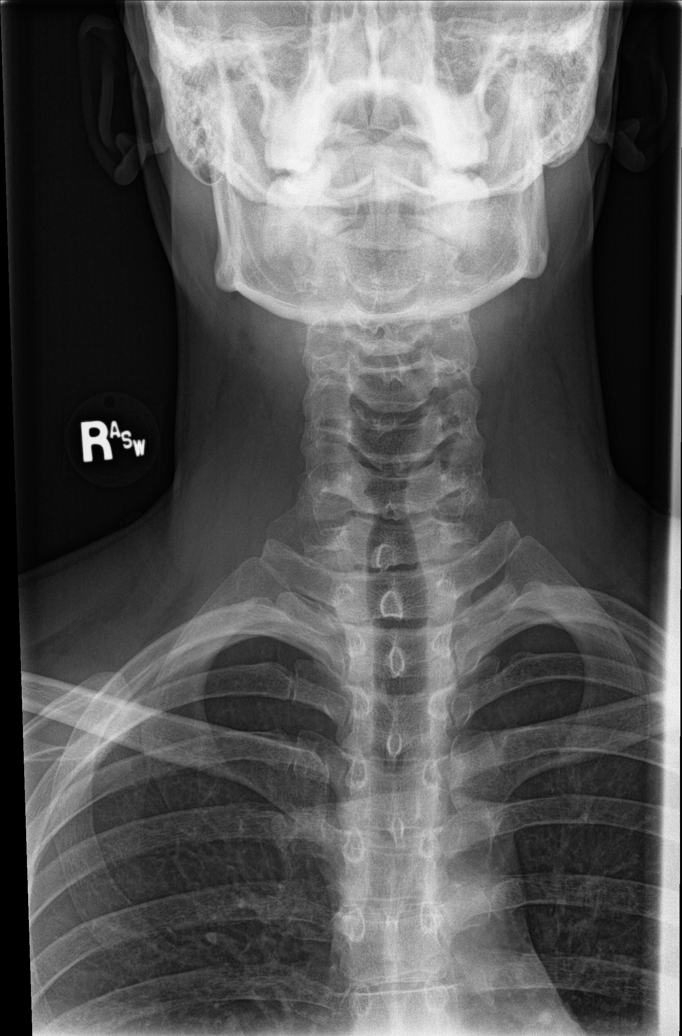

[c-spine open mouth]
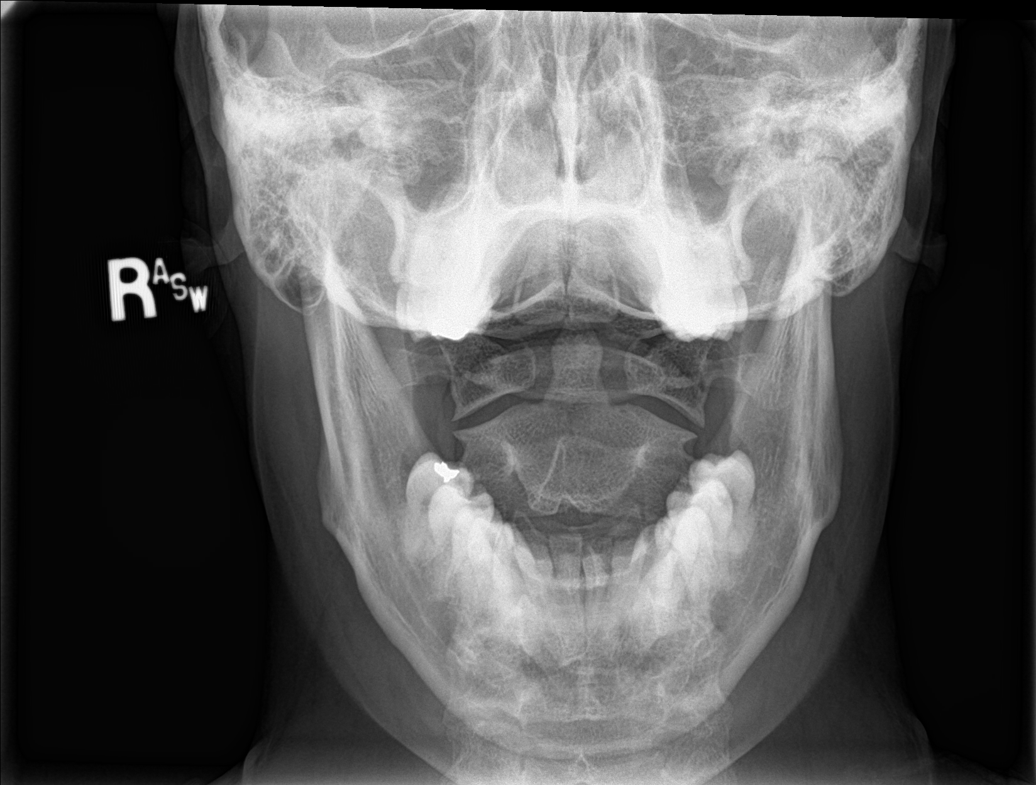

[ct-spine swimmers]
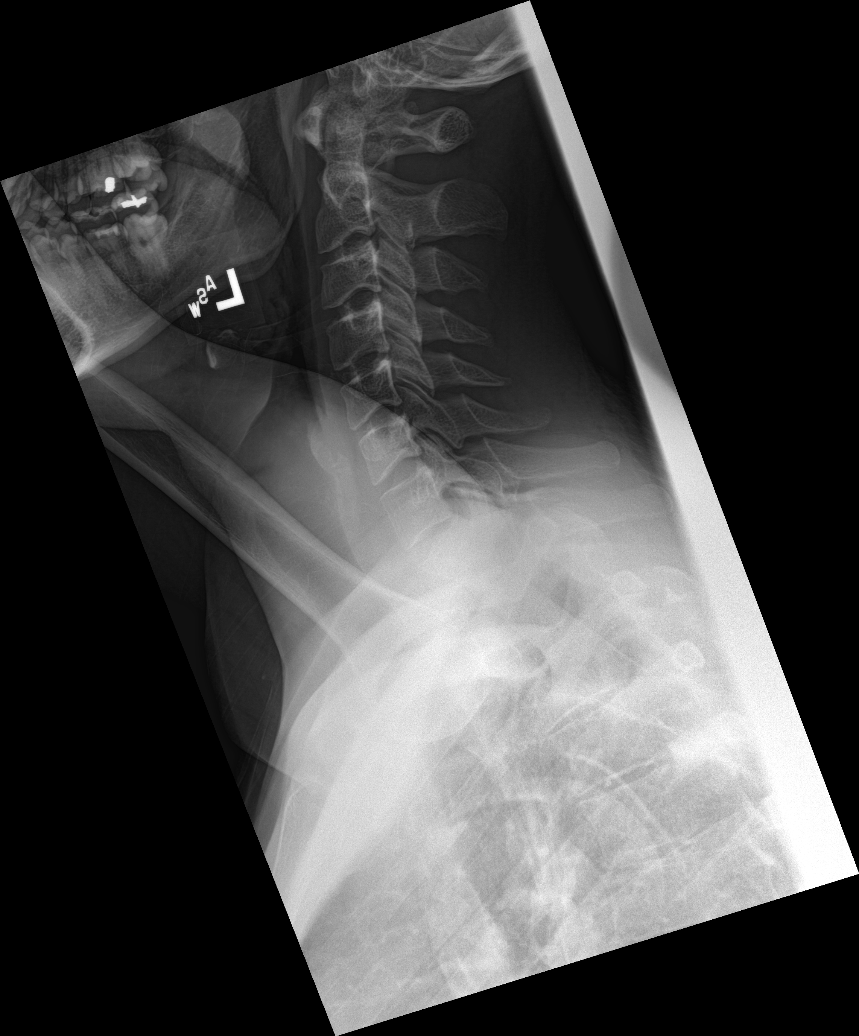

[6 of 6 positions shown; findings below may reference images not displayed]

FINDINGS: There is no evidence of cervical spine fracture or prevertebral soft
tissue swelling. Alignment is normal. No other significant bone
abnormalities are identified.
IMPRESSION: Negative cervical spine radiographs.

## 2019-06-01 ENCOUNTER — Ambulatory Visit: Payer: BC Managed Care – PPO | Admitting: Family Medicine

## 2019-06-01 ENCOUNTER — Encounter: Payer: Self-pay | Admitting: Family Medicine

## 2019-06-01 ENCOUNTER — Other Ambulatory Visit: Payer: Self-pay

## 2019-06-01 VITALS — BP 128/70 | HR 68 | Ht 71.0 in | Wt 170.0 lb

## 2019-06-01 DIAGNOSIS — R109 Unspecified abdominal pain: Secondary | ICD-10-CM | POA: Diagnosis not present

## 2019-06-01 LAB — POCT URINALYSIS DIPSTICK
Bilirubin, UA: NEGATIVE
Blood, UA: NEGATIVE
Glucose, UA: NEGATIVE
Ketones, UA: NEGATIVE
Leukocytes, UA: NEGATIVE
Nitrite, UA: NEGATIVE
Protein, UA: NEGATIVE
Spec Grav, UA: <=1.005 — AB (ref 1.010–1.025)
Urobilinogen, UA: 0.2 E.U./dL
pH, UA: 6.5 (ref 5.0–8.0)

## 2019-06-01 LAB — HEMOCCULT GUIAC POC 1CARD (OFFICE): Fecal Occult Blood, POC: NEGATIVE

## 2019-06-01 MED ORDER — MELOXICAM 15 MG PO TABS: 15.0000 mg | ORAL_TABLET | Freq: Every day | ORAL | 0 refills | Status: DC

## 2019-06-01 NOTE — Patient Instructions (Signed)
Flank Pain, Adult Flank pain is pain that is located on the side of the body between the upper abdomen and the back. This area is called the flank. The pain may occur over a short period of time (acute), or it may be long-term or recurring (chronic). It may be mild or severe. Flank pain can be caused by many things, including:  Muscle soreness or injury.  Kidney stones or kidney disease.  Stress.  A disease of the spine (vertebral disk disease).  A lung infection (pneumonia).  Fluid around the lungs (pulmonary edema).  A skin rash caused by the chickenpox virus (shingles).  Tumors that affect the back of the abdomen.  Gallbladder disease. Follow these instructions at home:   Drink enough fluid to keep your urine clear or pale yellow.  Rest as told by your health care provider.  Take over-the-counter and prescription medicines only as told by your health care provider.  Keep a journal to track what has caused your flank pain and what has made it feel better.  Keep all follow-up visits as told by your health care provider. This is important. Contact a health care provider if:  Your pain is not controlled with medicine.  You have new symptoms.  Your pain gets worse.  You have a fever.  Your symptoms last longer than 2-3 days.  You have trouble urinating or you are urinating very frequently. Get help right away if:  You have trouble breathing or you are short of breath.  Your abdomen hurts or it is swollen or red.  You have nausea or vomiting.  You feel faint or you pass out.  You have blood in your urine. Summary  Flank pain is pain that is located on the side of the body between the upper abdomen and the back.  The pain may occur over a short period of time (acute), or it may be long-term or recurring (chronic). It may be mild or severe.  Flank pain can be caused by many things.  Contact your health care provider if your symptoms get worse or they last  longer than 2-3 days. This information is not intended to replace advice given to you by your health care provider. Make sure you discuss any questions you have with your health care provider. Document Released: 12/02/2005 Document Revised: 09/23/2017 Document Reviewed: 12/24/2016 Elsevier Patient Education  2020 Elsevier Inc.  

## 2019-06-01 NOTE — Progress Notes (Signed)
Date:  06/01/2019   Name:  Arthur Brooks   DOB:  08/24/1991   MRN:  [id number]   Chief Complaint: Groin Pain (Left flank and left testicle pain. Intermittent. Hurt bad yesterday and currently not feeling any pain but concerned. )  Groin Pain The patient's primary symptoms include pelvic pain and testicular pain. This is a new problem. The current episode started in the past 7 days. The problem occurs intermittently. The problem has been waxing and waning. The pain is moderate. Associated symptoms include abdominal pain, flank pain and hematuria. Pertinent negatives include no chest pain, chills, constipation, coughing, diarrhea, discolored urine, dysuria, fever, frequency, headaches, hesitancy, nausea, rash, shortness of breath, sore throat or urgency. The testicular pain affects the left testicle. His past medical history is significant for an inguinal hernia.  Flank Pain This is a new problem. The current episode started 1 to 4 weeks ago. The problem occurs intermittently. The problem has been waxing and waning since onset. The pain is present in the thoracic spine. The quality of the pain is described as aching. Radiates to: left teste. The pain is at a severity of 7/10 (at worse). The pain is moderate. Exacerbated by: none/no positioning. Associated symptoms include abdominal pain and pelvic pain. Pertinent negatives include no bladder incontinence, bowel incontinence, chest pain, dysuria, fever, headaches, leg pain, numbness, paresis, paresthesias, perianal numbness, tingling or weakness. (Suprapubic) Risk factors: hx hernias. He has tried nothing for the symptoms.    Review of Systems  Constitutional: Negative for chills and fever.  HENT: Negative for drooling, ear discharge, ear pain and sore throat.   Respiratory: Negative for cough, shortness of breath and wheezing.   Cardiovascular: Negative for chest pain, palpitations and leg swelling.  Gastrointestinal: Positive for abdominal pain.  Negative for blood in stool, bowel incontinence, constipation, diarrhea and nausea.  Endocrine: Negative for polydipsia.  Genitourinary: Positive for flank pain, pelvic pain and testicular pain. Negative for bladder incontinence, dysuria, frequency, hematuria, hesitancy and urgency.  Musculoskeletal: Negative for back pain, myalgias and neck pain.  Skin: Negative for rash.  Allergic/Immunologic: Negative for environmental allergies.  Neurological: Negative for dizziness, tingling, weakness, numbness, headaches and paresthesias.  Hematological: Does not bruise/bleed easily.  Psychiatric/Behavioral: Negative for suicidal ideas. The patient is not nervous/anxious.     There are no active problems to display for this patient.   Allergies  Allergen Reactions  . Cefzil [Cefprozil] Rash    Past Surgical History:  Procedure Laterality Date  . EYE SURGERY     lazy eye correction  . HERNIA REPAIR    . TONSILLECTOMY      Social History   Tobacco Use  . Smoking status: Former Games developermoker  . Smokeless tobacco: Never Used  Substance Use Topics  . Alcohol use: Yes  . Drug use: No     Medication list has been reviewed and updated.  Current Meds  Medication Sig  . [DISCONTINUED] acyclovir (ZOVIRAX) 200 MG capsule Take 1 capsule (200 mg total) by mouth 4 (four) times daily.    PHQ 2/9 Scores 06/01/2019 05/20/2017 12/11/2015  PHQ - 2 Score 0 0 0    BP Readings from Last 3 Encounters:  06/01/19 128/70  12/28/17 120/80  12/22/17 120/80    Physical Exam Vitals signs and nursing note reviewed.  HENT:     Head: Normocephalic.     Right Ear: Tympanic membrane, ear canal and external ear normal.     Left Ear: Tympanic membrane and external ear  normal.     Nose: Nose normal.  Eyes:     General: No scleral icterus.       Right eye: No discharge.        Left eye: No discharge.     Conjunctiva/sclera: Conjunctivae normal.     Pupils: Pupils are equal, round, and reactive to light.  Neck:      Musculoskeletal: Normal range of motion and neck supple.     Thyroid: No thyromegaly.     Vascular: No JVD.     Trachea: No tracheal deviation.  Cardiovascular:     Rate and Rhythm: Normal rate and regular rhythm.     Heart sounds: Normal heart sounds. No murmur. No friction rub. No gallop.   Pulmonary:     Effort: No respiratory distress.     Breath sounds: Normal breath sounds. No wheezing or rales.  Abdominal:     General: Abdomen is flat. Bowel sounds are normal.     Palpations: Abdomen is soft. There is no hepatomegaly, splenomegaly or mass.     Tenderness: There is no abdominal tenderness. There is no right CVA tenderness, left CVA tenderness, guarding or rebound.     Hernia: No hernia is present. There is no hernia in the umbilical area, ventral area, left inguinal area or right inguinal area.  Genitourinary:    Penis: Normal and circumcised. No lesions.      Scrotum/Testes: Normal.        Right: Mass, tenderness, swelling, testicular hydrocele or varicocele not present.        Left: Mass, tenderness, swelling or varicocele not present.     Prostate: Normal. Not enlarged, not tender and no nodules present.     Rectum: Normal. Guaiac result negative.  Musculoskeletal: Normal range of motion.        General: No tenderness.     Thoracic back: He exhibits spasm.       Back:  Lymphadenopathy:     Cervical: No cervical adenopathy.     Lower Body: No right inguinal adenopathy. No left inguinal adenopathy.  Skin:    General: Skin is warm.     Findings: No rash.  Neurological:     Mental Status: He is alert and oriented to person, place, and time.     Cranial Nerves: No cranial nerve deficit.     Deep Tendon Reflexes: Reflexes are normal and symmetric.     Wt Readings from Last 3 Encounters:  06/01/19 170 lb (77.1 kg)  12/28/17 187 lb (84.8 kg)  12/22/17 189 lb (85.7 kg)    BP 128/70   Pulse 68   Ht 5\' 11"  (1.803 m)   Wt 170 lb (77.1 kg)   SpO2 99%   BMI 23.71  kg/m   Assessment and Plan: 1. Acute left flank pain New onset over the past week has gotten worse and now is better today.  Pain is in the left oblique area and radiates to the left testicular.  There is some residual discomfort in the left lower quadrant with no hematuria or hematochezia.  The history seems to be more suggestive of may be a small kidney stone or musculoskeletal issue.  Will initiate meloxicam 15 mg once a day.  Check a urinalysis noted no hematuria and the guaiac was negative on rectal exam.  Next step for pain should return or continue would be to consider a possible CT scan. - meloxicam (MOBIC) 15 MG tablet; Take 1 tablet (15 mg total) by mouth  daily.  Dispense: 14 tablet; Refill: 0 - POCT occult blood stool - POCT urinalysis dipstick

## 2019-06-08 ENCOUNTER — Ambulatory Visit: Payer: Self-pay | Admitting: Family Medicine

## 2019-12-23 ENCOUNTER — Ambulatory Visit: Payer: BC Managed Care – PPO | Attending: Internal Medicine

## 2019-12-23 DIAGNOSIS — Z23 Encounter for immunization: Secondary | ICD-10-CM | POA: Insufficient documentation

## 2019-12-23 NOTE — Progress Notes (Signed)
   Covid-19 Vaccination Clinic  Name:  Arthur Brooks    MRN: [id number] DOB: 07/31/1991  12/23/2019  [name] was observed post Covid-19 immunization for 15 minutes without incidence. He was provided with Vaccine Information Sheet and instruction to access the V-Safe system.   [name] was instructed to call 911 with any severe reactions post vaccine: Marland Kitchen Difficulty breathing  . Swelling of your face and throat  . A fast heartbeat  . A bad rash all over your body  . Dizziness and weakness    Immunizations Administered    Name Date Dose VIS Date Route   Pfizer COVID-19 Vaccine 12/23/2019  4:18 PM 0.3 mL 10/05/2019 Intramuscular   Manufacturer: [employer], [employer]   Lot: [id number]   NDC: [address]-5

## 2020-01-16 ENCOUNTER — Ambulatory Visit: Payer: BC Managed Care – PPO | Attending: Internal Medicine

## 2020-01-16 DIAGNOSIS — Z23 Encounter for immunization: Secondary | ICD-10-CM

## 2020-01-16 NOTE — Progress Notes (Signed)
   Covid-19 Vaccination Clinic  Name:  Arthur Brooks    MRN: [id number] DOB: 12-05-1990  01/16/2020  Mr. Arthur Brooks was observed post Covid-19 immunization for 15 minutes without incident. He was provided with Vaccine Information Sheet and instruction to access the V-Safe system.   Mr. Arthur Brooks was instructed to call 911 with any severe reactions post vaccine: Marland Kitchen Difficulty breathing  . Swelling of face and throat  . A fast heartbeat  . A bad rash all over body  . Dizziness and weakness   Immunizations Administered    Name Date Dose VIS Date Route   Pfizer COVID-19 Vaccine 01/16/2020  2:01 PM 0.3 mL 10/05/2019 Intramuscular   Manufacturer: [employer], [employer]   Lot: [id number]   NDC: [address]-1

## 2020-02-25 ENCOUNTER — Telehealth: Payer: Self-pay | Admitting: Family Medicine

## 2020-02-25 NOTE — Telephone Encounter (Signed)
Copied from CRM [id number]. Topic: Appointment Scheduling - Scheduling Inquiry for Clinic >> Feb 25, 2020  7:57 AM Leafy Ro wrote: Reason for CRM: pt said he was eating a chip and believe the chip cut inside of his throat. Pt would like an appt today

## 2020-02-25 NOTE — Telephone Encounter (Signed)
Pt advised to switch to liquid/ soft diet for 2 days and call back.

## 2020-02-28 ENCOUNTER — Other Ambulatory Visit: Payer: Self-pay

## 2020-02-28 ENCOUNTER — Ambulatory Visit: Payer: BC Managed Care – PPO | Admitting: Family Medicine

## 2020-02-28 ENCOUNTER — Encounter: Payer: Self-pay | Admitting: Family Medicine

## 2020-02-28 VITALS — BP 120/70 | HR 64 | Ht 71.0 in | Wt 167.0 lb

## 2020-02-28 DIAGNOSIS — R1314 Dysphagia, pharyngoesophageal phase: Secondary | ICD-10-CM | POA: Diagnosis not present

## 2020-02-28 DIAGNOSIS — H6981 Other specified disorders of Eustachian tube, right ear: Secondary | ICD-10-CM | POA: Diagnosis not present

## 2020-02-28 MED ORDER — AZITHROMYCIN 250 MG PO TABS: ORAL_TABLET | ORAL | 0 refills | Status: DC

## 2020-02-28 MED ORDER — MAGIC MOUTHWASH: 5.0000 mL | Freq: Three times a day (TID) | ORAL | 0 refills | Status: DC

## 2020-02-28 NOTE — Progress Notes (Signed)
Date:  02/28/2020   Name:  Arthur Brooks   DOB:  1991/04/22   MRN:  [id number]   Chief Complaint: Sore Throat (hurts when swallows and eats- started saturday after eating chips. now R) ear is hurting when swallows or lifts neck upward)  Sore Throat  This is a new problem. The current episode started in the past 7 days. The problem has been waxing and waning. Neither side of throat is experiencing more pain than the other. The pain is at a severity of 3/10. The pain is mild. Associated symptoms include trouble swallowing. Pertinent negatives include no abdominal pain, coughing, diarrhea, drooling, ear discharge, ear pain, headaches, hoarse voice, neck pain or shortness of breath. Associated symptoms comments: Right ear pain. He has tried gargles for the symptoms. The treatment provided moderate relief.    Lab Results  Component Value Date   CREATININE 1.12 06/29/2016   BUN 13 06/29/2016   NA 141 06/29/2016   K 4.1 06/29/2016   CL 98 06/29/2016   CO2 26 06/29/2016   No results found for: CHOL, HDL, LDLCALC, LDLDIRECT, TRIG, CHOLHDL Lab Results  Component Value Date   TSH 1.980 06/29/2016   No results found for: HGBA1C Lab Results  Component Value Date   WBC 5.3 06/29/2016   HGB 13.9 06/29/2016   HCT 41.0 06/29/2016   MCV 93 06/29/2016   PLT 212 06/29/2016   No results found for: ALT, AST, GGT, ALKPHOS, BILITOT   Review of Systems  Constitutional: Negative for chills and fever.  HENT: Positive for trouble swallowing. Negative for drooling, ear discharge, ear pain, hoarse voice and sore throat.   Respiratory: Negative for cough, shortness of breath and wheezing.   Cardiovascular: Negative for chest pain, palpitations and leg swelling.  Gastrointestinal: Negative for abdominal pain, blood in stool, constipation, diarrhea and nausea.  Endocrine: Negative for polydipsia.  Genitourinary: Negative for dysuria, frequency, hematuria and urgency.  Musculoskeletal: Negative for  back pain, myalgias and neck pain.  Skin: Negative for rash.  Allergic/Immunologic: Negative for environmental allergies.  Neurological: Negative for dizziness and headaches.  Hematological: Does not bruise/bleed easily.  Psychiatric/Behavioral: Negative for suicidal ideas. The patient is not nervous/anxious.     There are no problems to display for this patient.   Allergies  Allergen Reactions  . Cefzil [Cefprozil] Rash    Past Surgical History:  Procedure Laterality Date  . EYE SURGERY     lazy eye correction  . HERNIA REPAIR    . TONSILLECTOMY      Social History   Tobacco Use  . Smoking status: Former Research scientist (life sciences)  . Smokeless tobacco: Never Used  Substance Use Topics  . Alcohol use: Yes  . Drug use: No     Medication list has been reviewed and updated.  No outpatient medications have been marked as taking for the 02/28/20 encounter (Office Visit) with Juline Patch, MD.    [facility] 2/9 Scores 02/28/2020 06/01/2019 05/20/2017 12/11/2015  PHQ - 2 Score 0 0 0 0  PHQ- 9 Score 0 - - -    BP Readings from Last 3 Encounters:  02/28/20 120/70  06/01/19 128/70  12/28/17 120/80    Physical Exam Vitals and nursing note reviewed.  HENT:     Head: Normocephalic.     Jaw: There is normal jaw occlusion.     Right Ear: External ear normal. Tympanic membrane is retracted.     Left Ear: External ear normal. A middle ear effusion is present.  Nose: Nose normal.  Eyes:     General: No scleral icterus.       Right eye: No discharge.        Left eye: No discharge.     Conjunctiva/sclera: Conjunctivae normal.     Pupils: Pupils are equal, round, and reactive to light.  Neck:     Thyroid: No thyroid mass, thyromegaly or thyroid tenderness.     Vascular: No JVD.     Trachea: Trachea and phonation normal. No tracheal tenderness or tracheal deviation.  Cardiovascular:     Rate and Rhythm: Normal rate and regular rhythm.     Heart sounds: Normal heart sounds. No murmur. No friction  rub. No gallop.   Pulmonary:     Effort: No respiratory distress.     Breath sounds: Normal breath sounds. No wheezing or rales.  Abdominal:     General: Bowel sounds are normal.     Palpations: Abdomen is soft. There is no mass.     Tenderness: There is no abdominal tenderness. There is no guarding or rebound.  Musculoskeletal:        General: No tenderness. Normal range of motion.     Cervical back: Normal range of motion and neck supple.  Lymphadenopathy:     Cervical: No cervical adenopathy.     Right cervical: No superficial, deep or posterior cervical adenopathy.    Left cervical: No superficial, deep or posterior cervical adenopathy.  Skin:    General: Skin is warm.     Findings: No rash.  Neurological:     Mental Status: He is alert and oriented to person, place, and time.     Cranial Nerves: No cranial nerve deficit.     Deep Tendon Reflexes: Reflexes are normal and symmetric.     Wt Readings from Last 3 Encounters:  02/28/20 167 lb (75.8 kg)  06/01/19 170 lb (77.1 kg)  12/28/17 187 lb (84.8 kg)    BP 120/70   Pulse 64   Ht 5\' 11"  (1.803 m)   Wt 167 lb (75.8 kg)   BMI 23.29 kg/m   Assessment and Plan: 1. Pharyngoesophageal dysphagia New onset.  Unknown controlled.  Relatively stable able to swallow food but with discomfort.  There has been gradual improvement of this over the past couple of days and at this point in time we will initiate an antibiotic azithromycin 250 mg 2 today followed by 1 a day for 4 days.  And to mouthwash teaspoon 3 times a day swish and gargle and spit or swallow.  Patient is to call to confirm how he is doing next week.  If he is not attending to a positive recovery then our next step is referral to GI to take a look at the oropharyngeal pharyngeal esophageal transition to see if there is any localized trauma residual. - magic mouthwash SOLN; Take 5 mLs by mouth 3 (three) times daily.  Dispense: 120 mL; Refill: 0  2. Dysfunction  of right eustachian tube Bilateral dysfunction left both tympanic membranes but treated purely a retracted right eustachian tube.  And suggested the patient use his antihistamine to help with the eustachian tube swelling.

## 2021-09-15 ENCOUNTER — Ambulatory Visit: Payer: Self-pay | Admitting: Family Medicine

## 2021-09-15 ENCOUNTER — Other Ambulatory Visit: Payer: Self-pay

## 2021-09-15 ENCOUNTER — Encounter: Payer: Self-pay | Admitting: Family Medicine

## 2021-09-15 VITALS — BP 122/82 | HR 78 | Temp 98.7°F | Ht 71.0 in | Wt 177.0 lb

## 2021-09-15 DIAGNOSIS — R051 Acute cough: Secondary | ICD-10-CM

## 2021-09-15 DIAGNOSIS — J101 Influenza due to other identified influenza virus with other respiratory manifestations: Secondary | ICD-10-CM

## 2021-09-15 DIAGNOSIS — R6889 Other general symptoms and signs: Secondary | ICD-10-CM

## 2021-09-15 LAB — POCT INFLUENZA A/B
Influenza A, POC: POSITIVE — AB
Influenza B, POC: NEGATIVE

## 2021-09-15 MED ORDER — BENZONATATE 100 MG PO CAPS: 100.0000 mg | ORAL_CAPSULE | Freq: Three times a day (TID) | ORAL | 0 refills | Status: DC | PRN

## 2021-09-15 MED ORDER — OSELTAMIVIR PHOSPHATE 75 MG PO CAPS: 75.0000 mg | ORAL_CAPSULE | Freq: Two times a day (BID) | ORAL | 0 refills | Status: DC

## 2021-09-15 NOTE — Progress Notes (Signed)
Date:  09/15/2021   Name:  Arthur Brooks   DOB:  02/08/1991   MRN:  [id number]   Chief Complaint: Cough (Productive; yellow phlegm; negative home Covid test yesterday), Nasal Congestion (Started 09/13/21), and Emesis (X2 this morning)  Sinusitis This is a new problem. The current episode started more than 1 year ago. The problem has been gradually improving since onset. There has been no fever. The pain is moderate. Associated symptoms include congestion, coughing and sinus pressure. Pertinent negatives include no chills, diaphoresis, ear pain, headaches, hoarse voice, neck pain, shortness of breath, sneezing, sore throat or swollen glands. Past treatments include oral decongestants. The treatment provided mild relief.   Lab Results  Component Value Date   CREATININE 1.12 06/29/2016   BUN 13 06/29/2016   NA 141 06/29/2016   K 4.1 06/29/2016   CL 98 06/29/2016   CO2 26 06/29/2016   No results found for: CHOL, HDL, LDLCALC, LDLDIRECT, TRIG, CHOLHDL Lab Results  Component Value Date   TSH 1.980 06/29/2016   No results found for: HGBA1C Lab Results  Component Value Date   WBC 5.3 06/29/2016   HGB 13.9 06/29/2016   HCT 41.0 06/29/2016   MCV 93 06/29/2016   PLT 212 06/29/2016   No results found for: ALT, AST, GGT, ALKPHOS, BILITOT No components found for: VITD  Review of Systems  Constitutional:  Negative for chills, diaphoresis and fever.  HENT:  Positive for congestion and sinus pressure. Negative for drooling, ear discharge, ear pain, hoarse voice, sneezing and sore throat.   Respiratory:  Positive for cough. Negative for shortness of breath and wheezing.   Cardiovascular:  Negative for chest pain, palpitations and leg swelling.  Gastrointestinal:  Negative for abdominal pain, blood in stool, constipation, diarrhea and nausea.  Endocrine: Negative for polydipsia.  Genitourinary:  Negative for dysuria, frequency, hematuria and urgency.  Musculoskeletal:  Negative for  back pain, myalgias and neck pain.  Skin:  Negative for rash.  Allergic/Immunologic: Negative for environmental allergies.  Neurological:  Negative for dizziness and headaches.  Hematological:  Does not bruise/bleed easily.  Psychiatric/Behavioral:  Negative for suicidal ideas. The patient is not nervous/anxious.    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  No outpatient medications have been marked as taking for the 09/15/21 encounter (Office Visit) with Duanne LimerickJones, Rashard Ryle C, MD.    [facility] Scores 09/15/2021 02/28/2020 06/01/2019 05/20/2017  PHQ - 2 Score 0 0 0 0  PHQ- 9 Score 0 0 - -    GAD 7 : Generalized Anxiety Score 09/15/2021 02/28/2020  Nervous, Anxious, on Edge 0 0  Control/stop worrying 0 0  Worry too much - different things 0 0  Trouble relaxing 0 0  Restless 0 0  Easily annoyed or irritable 0 0  Afraid - awful might happen 0 0  Total GAD 7 Score 0 0  Anxiety  Difficulty Not difficult at all -    BP Readings from Last 3 Encounters:  09/15/21 122/82  02/28/20 120/70  06/01/19 128/70    Physical Exam Vitals and nursing note reviewed.  HENT:     Head: Normocephalic.     Right Ear: Tympanic membrane and external ear normal.     Left Ear: Tympanic membrane and external ear normal.     Nose: Nose normal. No congestion or rhinorrhea.     Mouth/Throat:     Mouth: Mucous membranes are moist.     Pharynx: No posterior oropharyngeal erythema.  Eyes:     General: No scleral icterus.       Right eye: No  discharge.        Left eye: No discharge.     Conjunctiva/sclera: Conjunctivae normal.     Pupils: Pupils are equal, round, and reactive to light.  Neck:     Thyroid: No thyromegaly.     Vascular: No carotid bruit or JVD.     Trachea: No tracheal deviation.  Cardiovascular:     Rate and Rhythm: Normal rate and regular rhythm.     Heart sounds: Normal heart sounds. No murmur heard.   No friction rub. No gallop.  Pulmonary:     Effort: No respiratory distress.     Breath sounds: Normal breath sounds. No stridor. No wheezing, rhonchi or rales.  Abdominal:     General: Bowel sounds are normal.     Palpations: Abdomen is soft. There is no mass.     Tenderness: There is no abdominal tenderness. There is no guarding or rebound.  Musculoskeletal:        General: No tenderness. Normal range of motion.     Cervical back: Normal range of motion and neck supple.  Lymphadenopathy:     Cervical: No cervical adenopathy.  Skin:    General: Skin is warm.     Findings: No rash.  Neurological:     Mental Status: He is alert and oriented to person, place, and time.     Cranial Nerves: No cranial nerve deficit.     Deep Tendon Reflexes: Reflexes are normal and symmetric.    Wt Readings from Last 3 Encounters:  09/15/21 177 lb (80.3 kg)  02/28/20 167 lb (75.8 kg)  06/01/19 170 lb (77.1 kg)    BP 122/82 (BP Location: Left Arm, Patient Position: Sitting, Cuff Size: Normal)   Pulse 78   Temp 98.7 F (37.1 C) (Oral)   Ht 5\' 11"  (1.803 m)   Wt 177 lb (80.3 kg)   SpO2 98%   BMI 24.69 kg/m   Assessment and Plan:  1. Influenza A Acute.  Persistent.  Relatively stable.  With cough upper sinus congestion and some nausea with vomiting.  Influenza test is positive for influenza A.  We will treat with Tamiflu 75 mg twice a day for 5 days. - oseltamivir (TAMIFLU) 75 MG capsule; Take 1 capsule (75 mg total) by mouth 2 (two) times daily.  Dispense: 10 capsule; Refill: 0  2. Acute cough Acute.   Persistent.  Episodic.  We will control with Tessalon Perles and have suggested obtaining Mucinex DM for cough control. - benzonatate (TESSALON PERLES) 100 MG capsule; Take 1 capsule (100 mg total) by mouth 3 (three) times daily as needed for cough.  Dispense: 20 capsule; Refill: 0  3. Flu-like symptoms Patient presents with cough, upper respiratory congestion, and emesis.  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  We will check influenza a given the current status of community - POCT Influenza A/B

## 2022-05-03 ENCOUNTER — Encounter: Payer: Self-pay | Admitting: Family Medicine

## 2022-05-03 ENCOUNTER — Ambulatory Visit: Payer: Self-pay | Admitting: *Deleted

## 2022-05-03 ENCOUNTER — Ambulatory Visit: Payer: BC Managed Care – PPO | Admitting: Family Medicine

## 2022-05-03 VITALS — BP 120/80 | HR 76 | Ht 71.0 in | Wt 185.0 lb

## 2022-05-03 DIAGNOSIS — M659 Synovitis and tenosynovitis, unspecified: Secondary | ICD-10-CM | POA: Diagnosis not present

## 2022-05-03 DIAGNOSIS — M654 Radial styloid tenosynovitis [de Quervain]: Secondary | ICD-10-CM

## 2022-05-03 MED ORDER — INDOMETHACIN 50 MG PO CAPS: 50.0000 mg | ORAL_CAPSULE | Freq: Three times a day (TID) | ORAL | 1 refills | Status: AC

## 2022-05-03 NOTE — Telephone Encounter (Signed)
  Chief Complaint: Toe pain Symptoms: Great toe right foot painful 7-8/10.States "Maybe gout"  States father with H/O. Mild swelling Frequency: Saturday evening Pertinent Negatives: Patient denies injury, fever, rash Disposition: [] ED /[] Urgent Care (no appt availability in office) / [x] Appointment(In office/virtual)/ []  [facility]/ [] Home Care/ [] Refused Recommended Disposition /[] [facility]/ []  Follow-up with PCP Additional Notes: Also reports right wrist pain x 2 weeks. Care advise provided, verbalizes understanding.  Reason for Disposition  [1] Swollen toe AND [2] no fever  (Exceptions: just localized bump from bunion, corns, insect bite, sting)  Answer Assessment - Initial Assessment Questions 1. ONSET: "When did the pain start?"      Sat evening 2. LOCATION: "Where is the pain located?"   (e.g., around nail, entire toe, at foot joint)      Right great toe 3. PAIN: "How bad is the pain?"    (Scale 1-10; or mild, moderate, severe)   -  MILD (1-3): doesn't interfere with normal activities    -  MODERATE (4-7): interferes with normal activities (e.g., work or school) or awakens from sleep, limping    -  SEVERE (8-10): excruciating pain, unable to do any normal activities, unable to walk 7-8/10     4. APPEARANCE: "What does the toe look like?" (e.g., redness, swelling, bruising, pallor)     Maybe little swollen 5. CAUSE: "What do you think is causing the toe pain?"     "Gout" 6. OTHER SYMPTOMS: "Do you have any other symptoms?" (e.g., leg pain, rash, fever, numbness)     Right wrist pain x 2 weeks, IBU helps. Worsening past week  Protocols used: Toe Pain-A-AH

## 2022-05-03 NOTE — Progress Notes (Signed)
Date:  05/03/2022   Name:  Arthur Brooks   DOB:  06/21/1991   MRN:  [id number]   Chief Complaint: Toe Pain (L) great toe pain- started hurting on Saturday after walking a lot at airport. Yesterday hurts to walk and continued hurting today. Had a lot of drinks over the weekend.) and Wrist Pain (R) wrist pain- noticed when lifting )  Toe Pain  The incident occurred 2 days ago. There was no injury mechanism. The pain is present in the left toes. The quality of the pain is described as aching. The pain is mild. The pain has been Improving since onset. Pertinent negatives include no inability to bear weight, loss of motion, loss of sensation, muscle weakness, numbness or tingling. He reports no foreign bodies present. The symptoms are aggravated by movement and palpation.  Wrist Pain  The pain is present in the right wrist (styloid process). This is a chronic problem. The current episode started more than 1 year ago. The problem occurs intermittently. The problem has been gradually improving. Pertinent negatives include no inability to bear weight, numbness or tingling.    Lab Results  Component Value Date   NA 141 06/29/2016   K 4.1 06/29/2016   CO2 26 06/29/2016   GLUCOSE 91 06/29/2016   BUN 13 06/29/2016   CREATININE 1.12 06/29/2016   CALCIUM 9.9 06/29/2016   GFRNONAA 91 06/29/2016   No results found for: "CHOL", "HDL", "LDLCALC", "LDLDIRECT", "TRIG", "CHOLHDL" Lab Results  Component Value Date   TSH 1.980 06/29/2016   No results found for: "HGBA1C" Lab Results  Component Value Date   WBC 5.3 06/29/2016   HGB 13.9 06/29/2016   HCT 41.0 06/29/2016   MCV 93 06/29/2016   PLT 212 06/29/2016   No results found for: "ALT", "AST", "GGT", "ALKPHOS", "BILITOT" No results found for: "25OHVITD2", "25OHVITD3", "VD25OH"   Review of Systems  Musculoskeletal:  Positive for arthralgias and myalgias.  Neurological:  Negative for tingling and numbness.    Patient Active Problem List    Diagnosis Date Noted   Injury of left knee 07/27/2016    Allergies  Allergen Reactions   Cefzil [Cefprozil] Rash    Past Surgical History:  Procedure Laterality Date   EYE SURGERY     lazy eye correction   HERNIA REPAIR     TONSILLECTOMY      Social History   Tobacco Use   Smoking status: Former   Smokeless tobacco: Never  Vaping Use   Vaping Use: Never used  Substance Use Topics   Alcohol use: Yes   Drug use: Never     Medication list has been reviewed and updated.  No outpatient medications have been marked as taking for the 05/03/22 encounter (Office Visit) with Duanne Limerick, MD.       05/03/2022    4:11 PM 09/15/2021   10:01 AM 02/28/2020    9:16 AM  GAD 7 : Generalized Anxiety Score  Nervous, Anxious, on Edge 0 0 0  Control/stop worrying 0 0 0  Worry too much - different things 0 0 0  Trouble relaxing 0 0 0  Restless 0 0 0  Easily annoyed or irritable 0 0 0  Afraid - awful might happen 0 0 0  Total GAD 7 Score 0 0 0  Anxiety Difficulty Not difficult at all Not difficult at all        05/03/2022    4:11 PM 09/15/2021   10:01 AM 02/28/2020  9:15 AM  Depression screen PHQ 2/9  Decreased Interest 0 0 0  Down, Depressed, Hopeless 0 0 0  PHQ - 2 Score 0 0 0  Altered sleeping 0 0 0  Tired, decreased energy 0 0 0  Change in appetite 0 0 0  Feeling bad or failure about yourself  0 0 0  Trouble concentrating 0 0 0  Moving slowly or fidgety/restless 0 0 0  Suicidal thoughts 0 0 0  PHQ-9 Score 0 0 0  Difficult doing work/chores Not difficult at all Not difficult at all     BP Readings from Last 3 Encounters:  05/03/22 120/80  09/15/21 122/82  02/28/20 120/70    Physical Exam Vitals and nursing note reviewed.  Musculoskeletal:     Right wrist: Tenderness present. Normal range of motion.     Left foot: Tenderness present. No swelling.     Comments: wristtender     Wt Readings from Last 3 Encounters:  05/03/22 185 lb (83.9 kg)  09/15/21 177  lb (80.3 kg)  02/28/20 167 lb (75.8 kg)    BP 120/80   Pulse 76   Ht 5\' 11"  (1.803 m)   Wt 185 lb (83.9 kg)   BMI 25.80 kg/m   Assessment and Plan:  1. Tenosynovitis of toe new onset.  Tenderness on the plantar aspect of the great toe.  I suspect this is tenosynovitis from the wearing of flip-flops and sandals and . We will check uric acid to make sure there is no gout possibility given the family history and treat with indomethacin 50 mg 3 times a day. - Uric acid - indomethacin (INDOCIN) 50 MG capsule; Take 1 capsule (50 mg total) by mouth 3 (three) times daily with meals.  Dispense: 30 capsule; Refill: 1  2. Tenosynovitis of radial styloid New onset.  Persistent.  This is in the styloid process of the right wrist."  Indomethacin 50 mg 3 times a day and will also be considered for use in this as well. - Uric acid - indomethacin (INDOCIN) 50 MG capsule; Take 1 capsule (50 mg total) by mouth 3 (three) times daily with meals.  Dispense: 30 capsule; Refill: 1

## 2022-05-04 ENCOUNTER — Encounter: Payer: Self-pay | Admitting: Family Medicine

## 2022-05-04 LAB — URIC ACID: Uric Acid: 8.6 mg/dL — ABNORMAL HIGH (ref 3.8–8.4)

## 2022-05-04 NOTE — Patient Instructions (Signed)
Low-Purine Eating Plan A low-purine eating plan involves making food choices to limit your purine intake. Purine is a kind of uric acid. Too much uric acid in your blood can cause certain conditions, such as gout and kidney stones. Eating a low-purine diet may help control these conditions. What are tips for following this plan? Shopping Avoid buying products that contain high-fructose corn syrup. Check for this on food labels. It is commonly found in many processed foods and soft drinks. Be sure to check for it in baked goods such as cookies, canned fruits, and cereals and cereal bars. Avoid buying veal, chicken breast with skin, lamb, and organ meats such as liver. These types of meats tend to have the highest purine content. Choose dairy products. These may lower uric acid levels. Avoid certain types of fish. Not all fish and seafood have high purine content. Examples with high purine content include anchovies, trout, tuna, sardines, and salmon. Avoid buying beverages that contain alcohol, particularly beer and hard liquor. Alcohol can affect the way your body gets rid of uric acid. Meal planning  Learn which foods do or do not affect you. If you find out that a food tends to cause your gout symptoms to flare up, avoid eating that food. You can enjoy foods that do not cause problems. If you have any questions about a food item, talk with your dietitian or health care provider. Reduce the overall amount of meat in your diet. When you do eat meat, choose ones with lower purine content. Include plenty of fruits and vegetables. Although some vegetables may have a high purine content--such as asparagus, mushrooms, spinach, or cauliflower--it has been shown that these do not contribute to uric acid blood levels as much. Consume at least 1 dairy serving a day. This has been shown to decrease uric acid levels. General information If you drink alcohol: Limit how much you have to: 0-1 drink a day for  women who are not pregnant. 0-2 drinks a day for men. Know how much alcohol is in a drink. In the [place] one drink equals one 12 oz bottle of beer (355 mL), one 5 oz glass of wine (148 mL), or one 1 oz glass of hard liquor (44 mL). Drink plenty of water. Try to drink enough to keep your urine pale yellow. Fluids can help remove uric acid from your body. Work with your health care provider and dietitian to develop a plan to achieve or maintain a healthy weight. Losing weight may help reduce uric acid in your blood. What foods are recommended? The following are some types of foods that are good choices when limiting purine intake: Fresh or frozen fruits and vegetables. Whole grains, breads, cereals, and pasta. Rice. Beans, peas, legumes. Nuts and seeds. Dairy products. Fats and oils. The items listed above may not be a complete list. Talk with a dietitian about what dietary choices are best for you. What foods are not recommended? Limit your intake of foods high in purines, including: Beer and other alcohol. Meat-based gravy or sauce. Canned or fresh fish, such as: Anchovies, sardines, herring, salmon, and tuna. Mussels and scallops. Codfish, trout, and haddock. Bacon, veal, chicken breast with skin, and lamb. Organ meats, such as: Liver or kidney. Tripe. Sweetbreads (thymus gland or pancreas). Wild game or goose. Yeast or yeast extract supplements. Drinks sweetened with high-fructose corn syrup, such as soda. Processed foods made with high-fructose corn syrup. The items listed above may not be a complete list of foods   and beverages you should limit. Contact a dietitian for more information. Summary Eating a low-purine diet may help control conditions caused by too much uric acid in the body, such as gout or kidney stones. Choose low-purine foods, limit alcohol, and limit high-fructose corn syrup. You will learn over time which foods do or do not affect you. If you find out that a  food tends to cause your gout symptoms to flare up, avoid eating that food. This information is not intended to replace advice given to you by your health care provider. Make sure you discuss any questions you have with your health care provider. Document Revised: 09/24/2021 Document Reviewed: 09/24/2021 Elsevier Patient Education  2023 [employer] Inc.  

## 2023-03-22 ENCOUNTER — Ambulatory Visit: Payer: BC Managed Care – PPO | Admitting: Family Medicine

## 2023-03-22 VITALS — BP 110/70 | HR 73 | Ht 71.0 in | Wt 182.0 lb

## 2023-03-22 DIAGNOSIS — M1A0720 Idiopathic chronic gout, left ankle and foot, without tophus (tophi): Secondary | ICD-10-CM | POA: Diagnosis not present

## 2023-03-22 NOTE — Progress Notes (Signed)
Date:  03/22/2023   Name:  Arthur Brooks   DOB:  Dec 04, 1990   MRN:  [id number]   Chief Complaint: Gout (No flare ups at this time, but would like to be on preventative for flare ups.- has had 2-3 flare ups in past 4 months- L) foot)  Ankle Pain  Incident onset: last episode. There was no injury mechanism. The pain is present in the left ankle. The quality of the pain is described as aching. The pain is mild. Associated symptoms include an inability to bear weight. Associated symptoms comments: Exquisite tenderness. The symptoms are aggravated by palpation, movement and weight bearing. He has tried NSAIDs for the symptoms.    Lab Results  Component Value Date   NA 141 06/29/2016   K 4.1 06/29/2016   CO2 26 06/29/2016   GLUCOSE 91 06/29/2016   BUN 13 06/29/2016   CREATININE 1.12 06/29/2016   CALCIUM 9.9 06/29/2016   GFRNONAA 91 06/29/2016   No results found for: "CHOL", "HDL", "LDLCALC", "LDLDIRECT", "TRIG", "CHOLHDL" Lab Results  Component Value Date   TSH 1.980 06/29/2016   No results found for: "HGBA1C" Lab Results  Component Value Date   WBC 5.3 06/29/2016   HGB 13.9 06/29/2016   HCT 41.0 06/29/2016   MCV 93 06/29/2016   PLT 212 06/29/2016   No results found for: "ALT", "AST", "GGT", "ALKPHOS", "BILITOT" No results found for: "25OHVITD2", "25OHVITD3", "VD25OH"   Review of Systems  Respiratory:  Negative for chest tightness.   Cardiovascular:  Negative for chest pain and palpitations.  Genitourinary:  Negative for difficulty urinating and dysuria.  Musculoskeletal:  Positive for arthralgias.    Patient Active Problem List   Diagnosis Date Noted  . Injury of left knee 07/27/2016    Allergies  Allergen Reactions  . Cefzil [Cefprozil] Rash    Past Surgical History:  Procedure Laterality Date  . EYE SURGERY     lazy eye correction  . HERNIA REPAIR    . TONSILLECTOMY      Social History   Tobacco Use  . Smoking status: Former  . Smokeless tobacco:  Current  . Tobacco comments:    dip  Vaping Use  . Vaping Use: Never used  Substance Use Topics  . Alcohol use: Yes  . Drug use: Never     Medication list has been reviewed and updated.  No outpatient medications have been marked as taking for the 03/22/23 encounter (Office Visit) with Duanne Limerick, MD.       03/22/2023    3:58 PM 05/03/2022    4:11 PM 09/15/2021   10:01 AM 02/28/2020    9:16 AM  GAD 7 : Generalized Anxiety Score  Nervous, Anxious, on Edge 0 0 0 0  Control/stop worrying 0 0 0 0  Worry too much - different things 0 0 0 0  Trouble relaxing 0 0 0 0  Restless 0 0 0 0  Easily annoyed or irritable 0 0 0 0  Afraid - awful might happen 0 0 0 0  Total GAD 7 Score 0 0 0 0  Anxiety Difficulty Not difficult at all Not difficult at all Not difficult at all        03/22/2023    3:58 PM 05/03/2022    4:11 PM 09/15/2021   10:01 AM  Depression screen PHQ 2/9  Decreased Interest 0 0 0  Down, Depressed, Hopeless 0 0 0  PHQ - 2 Score 0 0 0  Altered sleeping 0  0 0  Tired, decreased energy 0 0 0  Change in appetite 0 0 0  Feeling bad or failure about yourself  0 0 0  Trouble concentrating 0 0 0  Moving slowly or fidgety/restless 0 0 0  Suicidal thoughts 0 0 0  PHQ-9 Score 0 0 0  Difficult doing work/chores Not difficult at all Not difficult at all Not difficult at all    BP Readings from Last 3 Encounters:  03/22/23 110/70  05/03/22 120/80  09/15/21 122/82    Physical Exam Vitals and nursing note reviewed.  HENT:     Head: Normocephalic.     Right Ear: Tympanic membrane, ear canal and external ear normal.     Left Ear: Tympanic membrane, ear canal and external ear normal.     Nose: Nose normal. No congestion or rhinorrhea.  Eyes:     General: No scleral icterus.       Right eye: No discharge.        Left eye: No discharge.     Conjunctiva/sclera: Conjunctivae normal.     Pupils: Pupils are equal, round, and reactive to light.  Neck:     Thyroid: No  thyromegaly.     Vascular: No JVD.     Trachea: No tracheal deviation.  Cardiovascular:     Rate and Rhythm: Normal rate and regular rhythm.     Heart sounds: Normal heart sounds. No murmur heard.    No friction rub. No gallop.  Pulmonary:     Effort: No respiratory distress.     Breath sounds: Normal breath sounds. No wheezing, rhonchi or rales.  Abdominal:     General: Bowel sounds are normal.     Palpations: Abdomen is soft. There is no mass.     Tenderness: There is no abdominal tenderness. There is no guarding or rebound.  Musculoskeletal:        General: No tenderness. Normal range of motion.     Cervical back: Normal range of motion and neck supple.  Lymphadenopathy:     Cervical: No cervical adenopathy.  Skin:    General: Skin is warm.     Findings: No rash.  Neurological:     Mental Status: He is alert.    Wt Readings from Last 3 Encounters:  03/22/23 182 lb (82.6 kg)  05/03/22 185 lb (83.9 kg)  09/15/21 177 lb (80.3 kg)    BP 110/70   Pulse 73   Ht 5\' 11"  (1.803 m)   Wt 182 lb (82.6 kg)   SpO2 99%   BMI 25.38 kg/m   Assessment and Plan:     Elizabeth Sauer, MD

## 2023-03-23 ENCOUNTER — Other Ambulatory Visit: Payer: Self-pay

## 2023-03-23 ENCOUNTER — Encounter: Payer: Self-pay | Admitting: Family Medicine

## 2023-03-23 DIAGNOSIS — M1A0720 Idiopathic chronic gout, left ankle and foot, without tophus (tophi): Secondary | ICD-10-CM

## 2023-03-23 LAB — URIC ACID: Uric Acid: 8.7 mg/dL — ABNORMAL HIGH (ref 3.8–8.4)

## 2023-03-23 MED ORDER — ALLOPURINOL 100 MG PO TABS: 100.0000 mg | ORAL_TABLET | Freq: Every day | ORAL | 1 refills | Status: DC

## 2023-03-23 NOTE — Progress Notes (Signed)
Sent in allopurinol to [facility]

## 2023-09-08 ENCOUNTER — Telehealth: Payer: Self-pay | Admitting: Family Medicine

## 2023-09-08 ENCOUNTER — Other Ambulatory Visit: Payer: Self-pay | Admitting: Family Medicine

## 2023-09-08 ENCOUNTER — Encounter: Payer: Self-pay | Admitting: Family Medicine

## 2023-09-08 DIAGNOSIS — M1A0720 Idiopathic chronic gout, left ankle and foot, without tophus (tophi): Secondary | ICD-10-CM

## 2023-09-08 MED ORDER — ALLOPURINOL 100 MG PO TABS: 100.0000 mg | ORAL_TABLET | Freq: Every day | ORAL | 1 refills | Status: DC

## 2023-09-08 NOTE — Telephone Encounter (Signed)
Error Pt called for a refill.  However, this refill was done this am

## 2023-09-17 ENCOUNTER — Other Ambulatory Visit: Payer: Self-pay | Admitting: Family Medicine

## 2023-09-17 DIAGNOSIS — M1A0720 Idiopathic chronic gout, left ankle and foot, without tophus (tophi): Secondary | ICD-10-CM

## 2024-03-14 ENCOUNTER — Other Ambulatory Visit: Payer: Self-pay | Admitting: Family Medicine

## 2024-03-14 DIAGNOSIS — M1A0720 Idiopathic chronic gout, left ankle and foot, without tophus (tophi): Secondary | ICD-10-CM
